# Patient Record
Sex: Female | Born: 1985 | Race: White | Hispanic: No | Marital: Married | Smoking: Never smoker
Health system: Southern US, Community
[De-identification: ages and names within clinical notes are randomized; demographics above are authoritative.]

## PROBLEM LIST (undated history)

## (undated) ENCOUNTER — Inpatient Hospital Stay: Payer: Self-pay

## (undated) DIAGNOSIS — O1493 Unspecified pre-eclampsia, third trimester: Secondary | ICD-10-CM

## (undated) DIAGNOSIS — S42009A Fracture of unspecified part of unspecified clavicle, initial encounter for closed fracture: Secondary | ICD-10-CM

## (undated) DIAGNOSIS — S92909A Unspecified fracture of unspecified foot, initial encounter for closed fracture: Secondary | ICD-10-CM

## (undated) DIAGNOSIS — I341 Nonrheumatic mitral (valve) prolapse: Secondary | ICD-10-CM

## (undated) DIAGNOSIS — O1490 Unspecified pre-eclampsia, unspecified trimester: Secondary | ICD-10-CM

## (undated) HISTORY — PX: NO PAST SURGERIES: SHX2092

## (undated) HISTORY — PX: WISDOM TOOTH EXTRACTION: SHX21

---
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
## 2007-12-22 ENCOUNTER — Emergency Department: Payer: Self-pay | Admitting: Emergency Medicine

## 2007-12-27 ENCOUNTER — Emergency Department: Payer: Self-pay | Admitting: Emergency Medicine

## 2009-05-05 ENCOUNTER — Emergency Department: Payer: Self-pay | Admitting: Emergency Medicine

## 2011-11-20 ENCOUNTER — Emergency Department: Payer: Self-pay | Admitting: Emergency Medicine

## 2015-08-09 NOTE — L&D Delivery Note (Signed)
Delivery Note At 5:07 PM a vigorous female infant was delivered via Vaginal, Spontaneous Delivery (Presentation: Left Occiput Anterior).  APGAR: 8, 9; weight 6 lb 2 oz (2778 g).   Placenta status: Intact, Spontaneous.  Cord: 3 vessels with the following complications: None.    Anesthesia: Epidural  Episiotomy: None Lacerations: 2nd degree Suture Repair: 2-0 Vicryl, 3-0 Chromic Est. Blood Loss (mL): 400  Mom to postpartum.  Baby to Couplet care / Skin to Skin.  Rachael Morris, Rachael Morris 12/02/2015, 6:28 PM

## 2015-08-11 ENCOUNTER — Observation Stay
Admission: AD | Admit: 2015-08-11 | Discharge: 2015-08-11 | Disposition: A | Discharge status: Home / Self Care | Payer: Managed Care, Other (non HMO) | Source: Ambulatory Visit | Attending: Obstetrics and Gynecology | Admitting: Obstetrics and Gynecology

## 2015-08-11 ENCOUNTER — Encounter: Payer: Self-pay | Admitting: Emergency Medicine

## 2015-08-11 ENCOUNTER — Emergency Department
Admission: EM | Admit: 2015-08-11 | Discharge: 2015-08-11 | Disposition: A | Discharge status: Home / Self Care | Payer: Managed Care, Other (non HMO) | Attending: Emergency Medicine | Admitting: Emergency Medicine

## 2015-08-11 DIAGNOSIS — M25512 Pain in left shoulder: Secondary | ICD-10-CM

## 2015-08-11 DIAGNOSIS — S4992XA Unspecified injury of left shoulder and upper arm, initial encounter: Secondary | ICD-10-CM | POA: Diagnosis not present

## 2015-08-11 DIAGNOSIS — Y9389 Activity, other specified: Secondary | ICD-10-CM | POA: Diagnosis not present

## 2015-08-11 DIAGNOSIS — Y998 Other external cause status: Secondary | ICD-10-CM | POA: Diagnosis not present

## 2015-08-11 DIAGNOSIS — Z3A22 22 weeks gestation of pregnancy: Secondary | ICD-10-CM | POA: Insufficient documentation

## 2015-08-11 DIAGNOSIS — Y92410 Unspecified street and highway as the place of occurrence of the external cause: Secondary | ICD-10-CM | POA: Diagnosis not present

## 2015-08-11 DIAGNOSIS — O9A212 Injury, poisoning and certain other consequences of external causes complicating pregnancy, second trimester: Secondary | ICD-10-CM | POA: Diagnosis not present

## 2015-08-11 HISTORY — DX: Nonrheumatic mitral (valve) prolapse: I34.1

## 2015-08-11 NOTE — Discharge Instructions (Signed)
You can use ice 20 minutes every hour or to the shoulder or heat whichever makes it feel better. Do not fall asleep with ice or heating pad on the area because she can get burns or frostbite. You can use Tylenol for pain. [facility] when we let you out of here. They will make sure the baby is doing okay. Please follow-up with your doctor or Dr. Hyacinth MeekerMiller the orthopedic doctor to check on your shoulder is worse or not any better in 2-3 days.

## 2015-08-11 NOTE — Discharge Instructions (Signed)
Call your doctor or return to the Emergency Department if you have any questions or concerns.Rest and hydrate.

## 2015-08-11 NOTE — ED Notes (Signed)
Pt will taken upstairs to labor and delivery for fetal monitoring.
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
## 2015-08-26 ENCOUNTER — Encounter: Payer: Self-pay | Admitting: Physician Assistant

## 2015-08-26 ENCOUNTER — Ambulatory Visit: Payer: Self-pay | Admitting: Physician Assistant

## 2015-08-26 VITALS — BP 100/65 | HR 97 | Temp 98.4°F

## 2015-08-26 DIAGNOSIS — J00 Acute nasopharyngitis [common cold]: Secondary | ICD-10-CM

## 2015-08-26 NOTE — Progress Notes (Signed)
S: C/o runny nose and congestion with scratchy throat for 2 days, no fever, chills, cp/sob, v/d;  cough is sporadic, is 24 weeks preg, has not tried any otc meds  O: PE: vitals wnl, nad, perrl eomi, normocephalic, tms dull, nasal mucosa red and swollen, throat injected, neck supple no lymph, lungs c t a, cv rrr, neuro intact  A:  Acute viral uri   P: drink fluids, continue regular meds , use otc meds of choice like tylenol cold which is approved by ob/gyn, return if not improving in 5 days, return earlier if worsening

## 2015-09-17 ENCOUNTER — Encounter: Payer: Self-pay | Admitting: Obstetrics and Gynecology

## 2015-12-01 ENCOUNTER — Inpatient Hospital Stay
Admission: EM | Admit: 2015-12-01 | Discharge: 2015-12-04 | DRG: 775 | Disposition: A | Discharge status: Home / Self Care | Payer: Managed Care, Other (non HMO) | Attending: Certified Nurse Midwife | Admitting: Certified Nurse Midwife

## 2015-12-01 ENCOUNTER — Encounter: Payer: Self-pay | Admitting: *Deleted

## 2015-12-01 DIAGNOSIS — Z3A37 37 weeks gestation of pregnancy: Secondary | ICD-10-CM | POA: Diagnosis not present

## 2015-12-01 DIAGNOSIS — O1493 Unspecified pre-eclampsia, third trimester: Secondary | ICD-10-CM | POA: Diagnosis present

## 2015-12-01 DIAGNOSIS — Z23 Encounter for immunization: Secondary | ICD-10-CM

## 2015-12-01 DIAGNOSIS — O1494 Unspecified pre-eclampsia, complicating childbirth: Secondary | ICD-10-CM | POA: Diagnosis present

## 2015-12-01 DIAGNOSIS — O163 Unspecified maternal hypertension, third trimester: Secondary | ICD-10-CM | POA: Diagnosis present

## 2015-12-01 HISTORY — DX: Unspecified pre-eclampsia, third trimester: O14.93

## 2015-12-01 HISTORY — DX: Unspecified pre-eclampsia, unspecified trimester: O14.90

## 2015-12-01 LAB — URINE DRUG SCREEN, QUALITATIVE (ARMC ONLY)
AMPHETAMINES, UR SCREEN: NOT DETECTED
BENZODIAZEPINE, UR SCRN: NOT DETECTED
Barbiturates, Ur Screen: NOT DETECTED
COCAINE METABOLITE, UR ~~LOC~~: NOT DETECTED
Cannabinoid 50 Ng, Ur ~~LOC~~: NOT DETECTED
MDMA (Ecstasy)Ur Screen: NOT DETECTED
METHADONE SCREEN, URINE: NOT DETECTED
OPIATE, UR SCREEN: NOT DETECTED
PHENCYCLIDINE (PCP) UR S: NOT DETECTED
Tricyclic, Ur Screen: NOT DETECTED

## 2015-12-01 LAB — COMPREHENSIVE METABOLIC PANEL
ALBUMIN: 2.8 g/dL — AB (ref 3.5–5.0)
ALK PHOS: 142 U/L — AB (ref 38–126)
ALT: 15 U/L (ref 14–54)
AST: 21 U/L (ref 15–41)
Anion gap: 7 (ref 5–15)
BILIRUBIN TOTAL: 0.5 mg/dL (ref 0.3–1.2)
BUN: 11 mg/dL (ref 6–20)
CALCIUM: 9 mg/dL (ref 8.9–10.3)
CO2: 23 mmol/L (ref 22–32)
Chloride: 106 mmol/L (ref 101–111)
Creatinine, Ser: 0.66 mg/dL (ref 0.44–1.00)
GFR calc Af Amer: >60 mL/min (ref >60)
GFR calc non Af Amer: >60 mL/min (ref >60)
GLUCOSE: 104 mg/dL — AB (ref 65–99)
POTASSIUM: 4 mmol/L (ref 3.5–5.1)
SODIUM: 136 mmol/L (ref 135–145)
TOTAL PROTEIN: 6.1 g/dL — AB (ref 6.5–8.1)

## 2015-12-01 LAB — TYPE AND SCREEN
ABO/RH(D): B POS
Antibody Screen: NEGATIVE

## 2015-12-01 LAB — PROTEIN / CREATININE RATIO, URINE
Creatinine, Urine: 183 mg/dL
Protein Creatinine Ratio: 0.34 mg/mg{Cre} — ABNORMAL HIGH (ref 0.00–0.15)
TOTAL PROTEIN, URINE: 62 mg/dL

## 2015-12-01 LAB — CBC
HEMATOCRIT: 30.6 % — AB (ref 35.0–47.0)
Hemoglobin: 10.7 g/dL — ABNORMAL LOW (ref 12.0–16.0)
MCH: 29.7 pg (ref 26.0–34.0)
MCHC: 35 g/dL (ref 32.0–36.0)
MCV: 84.9 fL (ref 80.0–100.0)
Platelets: 247 10*3/uL (ref 150–440)
RBC: 3.61 MIL/uL — ABNORMAL LOW (ref 3.80–5.20)
RDW: 14.3 % (ref 11.5–14.5)
WBC: 9 10*3/uL (ref 3.6–11.0)

## 2015-12-01 LAB — ABO/RH: ABO/RH(D): B POS

## 2015-12-01 MED ORDER — LACTATED RINGERS IV SOLN
INTRAVENOUS | Status: DC
  Administered 2015-12-01 – 2015-12-02 (×3): via INTRAVENOUS

## 2015-12-01 MED ORDER — OXYTOCIN 10 UNIT/ML IJ SOLN
INTRAMUSCULAR | Status: AC
  Filled 2015-12-01: qty 2

## 2015-12-01 MED ORDER — DINOPROSTONE 10 MG VA INST
10.0000 mg | VAGINAL_INSERT | Freq: Once | VAGINAL | Status: AC
  Administered 2015-12-01: 10 mg via VAGINAL
  Filled 2015-12-01 (×2): qty 1

## 2015-12-01 MED ORDER — AMMONIA AROMATIC IN INHA
RESPIRATORY_TRACT | Status: AC
  Filled 2015-12-01: qty 10

## 2015-12-01 MED ORDER — OXYTOCIN 40 UNITS IN LACTATED RINGERS INFUSION - SIMPLE MED
2.5000 [IU]/h | INTRAVENOUS | Status: DC
  Filled 2015-12-01: qty 1000

## 2015-12-01 MED ORDER — ONDANSETRON HCL 4 MG/2ML IJ SOLN
4.0000 mg | Freq: Four times a day (QID) | INTRAMUSCULAR | Status: DC | PRN
  Administered 2015-12-02 (×2): 4 mg via INTRAVENOUS
  Filled 2015-12-01 (×2): qty 2

## 2015-12-01 MED ORDER — OXYTOCIN BOLUS FROM INFUSION: 500.0000 mL | INTRAVENOUS | Status: DC

## 2015-12-01 MED ORDER — LIDOCAINE HCL (PF) 1 % IJ SOLN: 30.0000 mL | INTRAMUSCULAR | Status: DC | PRN

## 2015-12-01 MED ORDER — ZOLPIDEM TARTRATE 5 MG PO TABS
5.0000 mg | ORAL_TABLET | Freq: Every evening | ORAL | Status: DC | PRN
  Administered 2015-12-01: 5 mg via ORAL
  Filled 2015-12-01: qty 1

## 2015-12-01 MED ORDER — ACETAMINOPHEN 325 MG PO TABS: 650.0000 mg | ORAL_TABLET | ORAL | Status: DC | PRN

## 2015-12-01 MED ORDER — SODIUM CHLORIDE FLUSH 0.9 % IV SOLN
INTRAVENOUS | Status: AC
  Administered 2015-12-01: 21:00:00
  Filled 2015-12-01: qty 10

## 2015-12-01 MED ORDER — LIDOCAINE HCL (PF) 1 % IJ SOLN
INTRAMUSCULAR | Status: AC
  Filled 2015-12-01: qty 30

## 2015-12-01 MED ORDER — MISOPROSTOL 200 MCG PO TABS
ORAL_TABLET | ORAL | Status: AC
  Filled 2015-12-01: qty 4

## 2015-12-01 MED ORDER — CITRIC ACID-SODIUM CITRATE 334-500 MG/5ML PO SOLN: 30.0000 mL | ORAL | Status: DC | PRN

## 2015-12-01 MED ORDER — LACTATED RINGERS IV SOLN: 500.0000 mL | INTRAVENOUS | Status: DC | PRN

## 2015-12-01 MED ORDER — TERBUTALINE SULFATE 1 MG/ML IJ SOLN: 0.2500 mg | Freq: Once | INTRAMUSCULAR | Status: DC | PRN

## 2015-12-01 NOTE — Plan of Care (Signed)
Problem: Education: Goal: Knowledge of disease or condition will improve Outcome: Progressing MD discussed elevated bp, lab results, and pre-e disease process with pt and family, questions addressed, pt agrees with plan for IOL with Cervidil.    Problem: Coping: Goal: Coping behaviors will improve Outcome: Progressing Pt a bit anxious about labor induction but handling process well at this time, allowed to eat dinner prior to med being place for IOL. PIV in place, Cervidil placed at 2154. Says she will need something to help her rest. Pt tolerating induction process at this time.

## 2015-12-01 NOTE — OB Triage Note (Signed)
Sent from office for elevated BP. Pt denies headache, abdominal pain, spots in eyes. Reflexes 3+, no clonus. 1+ edema noted in ankles and feet.  Pt. States some discomfort with urination. Denies burning.

## 2015-12-01 NOTE — H&P (Signed)
Obstetric H&P   Chief Complaint: Elevated BP in clinic  Prenatal Care Provider: [facility]  History of Present Illness: 30 y.o. G1P0 at 673468w6d by 7 week ultrasound presenting to L&D for elevated BP noted at routine prenatal visit in clinic today (144/92), negative protein, 2lbs weight gain in last week.  The patient has no history of pre-existing hypertension, has been normotensive at her other prenatal care appointments.  No HA, vision changes, RUQ/epigastric pain, increased edema.  +FM,denies LOF, VB, or ctx  B pos / ABSC neg / RI / VZNI / HBsAg neg / RPR NR / HIV neg / 1-hr 98 / GBS negative   31lbs weight gain this pregnancy  Review of Systems: 10 point review of systems negative unless otherwise noted in HPI  Past Medical History: Past Medical History  Diagnosis Date  . Mitral valve prolapse     Past Surgical History: No past surgical history on file.  Family History: No family history on file.  Social History: Social History   Social History  . Marital Status: Married    Spouse Name: N/A  . Number of Children: N/A  . Years of Education: N/A   Occupational History  . Not on file.   Social History Main Topics  . Smoking status: Never Smoker   . Smokeless tobacco: Not on file  . Alcohol Use: No  . Drug Use: No  . Sexual Activity: Yes    Birth Control/ Protection: None   Other Topics Concern  . Not on file   Social History Narrative    Medications: Prior to Admission medications   Medication Sig Start Date End Date Taking? Authorizing Provider  Prenatal Vit-Fe Fumarate-FA (PRENATAL MULTIVITAMIN) TABS tablet Take 1 tablet by mouth daily at 12 noon.    Historical Provider, MD    Allergies: Allergies  Allergen Reactions  . Codeine Other (See Comments)    Mood swings?    Physical Exam: Filed Vitals:   12/01/15 1629 12/01/15 1631 12/01/15 1650  BP:  128/87 134/80  Pulse:  88 91  Temp: 98.6 F (37 C)    TempSrc: Oral    Resp: 18    Height: 5\' 2"  (1.575  m)    Weight: 68.04 kg (150 lb)      Urine Dip Protein: P/C ratio pending  FHT: 135, moderate, +accels, no decels Toco: irregular  General: NAD HEENT: normocephalic, anicteric Pulmonary: no increased work of breathing Abdomen: Gravid, non-tender Leopolds: vtx Genitourinary: 1/50/-3 in clinic Extremities: no edema  Labs: No results found for this or any previous visit (from the past 24 hour(s)).  Assessment: 30 y.o. G1P0 at 5868w6d preeclampsia without severe features  Plan: 1) Preeclampsia without severe features -  Diastolic BP consistently elevated.    - proceed with cervidil IOL per ACOG "Hypertension in Pregnancy" Task Force Report November 14th 2013   2) Fetus - cat I tracing  3) PNL - B pos / ABSC neg / RI / VZNI / HBsAg neg / RPR NR / HIV neg / 1-hr 98 / GBS negative   4) TDAP - needs, influenza up to date  5) Disposition - pending delivery

## 2015-12-01 NOTE — Progress Notes (Signed)
Dr Bonney AidStaebler in room speaking with pt about lab, bp reading elevated, protein in urine and discussed IOL for pree without severe features. Spoke with pt, s/o, and family at length about induction and labor process.

## 2015-12-02 ENCOUNTER — Inpatient Hospital Stay: Payer: Managed Care, Other (non HMO) | Admitting: Anesthesiology

## 2015-12-02 LAB — PLATELET COUNT: PLATELETS: 233 10*3/uL (ref 150–440)

## 2015-12-02 MED ORDER — SODIUM CHLORIDE 0.9% FLUSH: 3.0000 mL | INTRAVENOUS | Status: DC | PRN

## 2015-12-02 MED ORDER — ONDANSETRON HCL 4 MG/2ML IJ SOLN: 4.0000 mg | INTRAMUSCULAR | Status: DC | PRN

## 2015-12-02 MED ORDER — BUPIVACAINE HCL (PF) 0.25 % IJ SOLN
INTRAMUSCULAR | Status: DC | PRN
  Administered 2015-12-02 (×2): 4 mL via EPIDURAL

## 2015-12-02 MED ORDER — FENTANYL 2.5 MCG/ML W/ROPIVACAINE 0.2% IN NS 100 ML EPIDURAL INFUSION (ARMC-ANES): 10.0000 mL/h | EPIDURAL | Status: DC

## 2015-12-02 MED ORDER — FENTANYL 2.5 MCG/ML W/ROPIVACAINE 0.2% IN NS 100 ML EPIDURAL INFUSION (ARMC-ANES)
EPIDURAL | Status: DC | PRN
  Administered 2015-12-02: 9 mL/h via EPIDURAL

## 2015-12-02 MED ORDER — ONDANSETRON HCL 4 MG/2ML IJ SOLN: 4.0000 mg | Freq: Three times a day (TID) | INTRAMUSCULAR | Status: DC | PRN

## 2015-12-02 MED ORDER — COCONUT OIL OIL
1.0000 "application " | TOPICAL_OIL | Status: DC | PRN
  Filled 2015-12-02: qty 120

## 2015-12-02 MED ORDER — DOCUSATE SODIUM 100 MG PO CAPS
100.0000 mg | ORAL_CAPSULE | Freq: Every day | ORAL | Status: DC
  Administered 2015-12-03 – 2015-12-04 (×2): 100 mg via ORAL
  Filled 2015-12-02 (×2): qty 1

## 2015-12-02 MED ORDER — NALOXONE HCL 2 MG/2ML IJ SOSY
1.0000 ug/kg/h | PREFILLED_SYRINGE | INTRAVENOUS | Status: DC | PRN
  Filled 2015-12-02: qty 2

## 2015-12-02 MED ORDER — NALBUPHINE HCL 10 MG/ML IJ SOLN: 5.0000 mg | Freq: Once | INTRAMUSCULAR | Status: DC | PRN

## 2015-12-02 MED ORDER — LIDOCAINE HCL (PF) 1 % IJ SOLN
INTRAMUSCULAR | Status: DC | PRN
  Administered 2015-12-02: 3 mL

## 2015-12-02 MED ORDER — FENTANYL 2.5 MCG/ML W/ROPIVACAINE 0.2% IN NS 100 ML EPIDURAL INFUSION (ARMC-ANES)
9.0000 mL/h | EPIDURAL | Status: DC
  Filled 2015-12-02: qty 100

## 2015-12-02 MED ORDER — KETOROLAC TROMETHAMINE 30 MG/ML IJ SOLN: 30.0000 mg | Freq: Four times a day (QID) | INTRAMUSCULAR | Status: DC | PRN

## 2015-12-02 MED ORDER — ONDANSETRON HCL 4 MG PO TABS: 4.0000 mg | ORAL_TABLET | ORAL | Status: DC | PRN

## 2015-12-02 MED ORDER — TERBUTALINE SULFATE 1 MG/ML IJ SOLN
0.2500 mg | Freq: Once | INTRAMUSCULAR | Status: DC | PRN
Start: 2015-12-02 — End: 2015-12-02

## 2015-12-02 MED ORDER — SIMETHICONE 80 MG PO CHEW: 80.0000 mg | CHEWABLE_TABLET | ORAL | Status: DC | PRN

## 2015-12-02 MED ORDER — HYDROCODONE-ACETAMINOPHEN 5-325 MG PO TABS
1.0000 | ORAL_TABLET | Freq: Four times a day (QID) | ORAL | Status: DC | PRN
  Administered 2015-12-03 (×2): 2 via ORAL
  Administered 2015-12-03 – 2015-12-04 (×2): 1 via ORAL
  Filled 2015-12-02: qty 1
  Filled 2015-12-02 (×2): qty 2
  Filled 2015-12-02: qty 1

## 2015-12-02 MED ORDER — MEPERIDINE HCL 25 MG/ML IJ SOLN
6.2500 mg | INTRAMUSCULAR | Status: DC | PRN
Start: 2015-12-02 — End: 2015-12-02

## 2015-12-02 MED ORDER — DIBUCAINE 1 % RE OINT: 1.0000 "application " | TOPICAL_OINTMENT | RECTAL | Status: DC | PRN

## 2015-12-02 MED ORDER — DIPHENHYDRAMINE HCL 25 MG PO CAPS: 25.0000 mg | ORAL_CAPSULE | ORAL | Status: DC | PRN

## 2015-12-02 MED ORDER — WITCH HAZEL-GLYCERIN EX PADS: 1.0000 "application " | MEDICATED_PAD | CUTANEOUS | Status: DC | PRN

## 2015-12-02 MED ORDER — NALBUPHINE HCL 10 MG/ML IJ SOLN: 5.0000 mg | INTRAMUSCULAR | Status: DC | PRN

## 2015-12-02 MED ORDER — FERROUS SULFATE 325 (65 FE) MG PO TABS
325.0000 mg | ORAL_TABLET | Freq: Every day | ORAL | Status: DC
  Administered 2015-12-03 – 2015-12-04 (×2): 325 mg via ORAL
  Filled 2015-12-02 (×2): qty 1

## 2015-12-02 MED ORDER — BUTORPHANOL TARTRATE 1 MG/ML IJ SOLN
1.0000 mg | INTRAMUSCULAR | Status: DC | PRN
  Administered 2015-12-02 (×4): 1 mg via INTRAVENOUS
  Filled 2015-12-02: qty 2
  Filled 2015-12-02 (×3): qty 1

## 2015-12-02 MED ORDER — OXYTOCIN 40 UNITS IN LACTATED RINGERS INFUSION - SIMPLE MED
1.0000 m[IU]/min | INTRAVENOUS | Status: DC
  Administered 2015-12-02: 1 m[IU]/min via INTRAVENOUS

## 2015-12-02 MED ORDER — DIPHENHYDRAMINE HCL 50 MG/ML IJ SOLN: 12.5000 mg | INTRAMUSCULAR | Status: DC | PRN

## 2015-12-02 MED ORDER — PRENATAL MULTIVITAMIN CH
1.0000 | ORAL_TABLET | Freq: Every day | ORAL | Status: DC
  Administered 2015-12-04: 1 via ORAL
  Filled 2015-12-02: qty 1

## 2015-12-02 MED ORDER — BENZOCAINE-MENTHOL 20-0.5 % EX AERO
1.0000 "application " | INHALATION_SPRAY | CUTANEOUS | Status: DC | PRN
  Filled 2015-12-02 (×2): qty 56

## 2015-12-02 MED ORDER — IBUPROFEN 600 MG PO TABS
600.0000 mg | ORAL_TABLET | Freq: Four times a day (QID) | ORAL | Status: DC
  Administered 2015-12-02 – 2015-12-04 (×7): 600 mg via ORAL
  Filled 2015-12-02 (×7): qty 1

## 2015-12-02 MED ORDER — LIDOCAINE-EPINEPHRINE (PF) 1.5 %-1:200000 IJ SOLN
INTRAMUSCULAR | Status: DC | PRN
  Administered 2015-12-02: 3 mL via EPIDURAL

## 2015-12-02 MED ORDER — NALOXONE HCL 0.4 MG/ML IJ SOLN: 0.4000 mg | INTRAMUSCULAR | Status: DC | PRN

## 2015-12-02 NOTE — Progress Notes (Signed)
L&D Progress Note  [age] year old G1 P0 with EDC=12/16/2015 was admitted yesterday for IOL for preeclampsia without severe features. She had Cervidil inserted last night and it was removed this Am at 0740 due to hyperstimulation. Cervix last checked at 0330 and was 1.5/60%. She has received multiple doses of Stadol and is not very tolerant of cervical exams.  S: Nauseous intermittently, tired, has not slept, complains of painful contractions O: BP 133/66 mmHg  Pulse 71  Temp(Src) 97.7 F (36.5 C) (Axillary)  Resp 16  Ht 5\' 2"  (1.575 m)  Wt 68.04 kg (150 lb)  BMI 27.43 kg/m2  SpO2 99% Most blood pressures overnight in the mild range: 130s to 140s/80s-90s  General: trouble keeping eyes open, wakes with contractions [id number] with accelerations to 140, moderate variability Toco: q1-3 min apart, some palpating moderate Cervix: 1.5/75%/-1  A: IUP at 38 weeks with preeclampsia without severe features  BPs in the normal to mild range Little progress with the contractions she is currently having Reassuring FHR tracing  P: Discussed Pitocin augmentation after instituting better pain relief. Will try nitrous oxide in early labor and move to an epidural once more active. Consider foley catheter if contracting too frequently or no cx change  Derrin Currey, CNM  '

## 2015-12-02 NOTE — Plan of Care (Signed)
Problem: Education: Goal: Knowledge of disease or condition will improve Outcome: Progressing BP remains stable and wnl this shift, no BP treated. Pt denies any ha pain, vision changes, dizziness, chest pain, sob or epigastric pain. Occasionally feeling nauseated after Stadol, resolved with Zofran IV. Pt tolerating labor well, however, she does have difficulty tolerating cervical exam. Coaching and family support encouraged and promoted. Cervidil remains in place.

## 2015-12-02 NOTE — Progress Notes (Signed)
L&D Progress Note   S: Contractions have gotten stronger since SROM about 45 minutes ago. Nitrous oxide working well.  O: BP 116/71 mmHg  Pulse 79  Temp(Src) 97.7 F (36.5 C) (Axillary)  Resp 16  Ht 5\' 2"  (1.575 m)  Wt 68.04 kg (150 lb)  BMI 27.43 kg/m2  SpO2 100%  Blood pressure great with use of nitrous-better pain relief. SROM for clear fluid around 1030 FHR: 130 with early decelerations Toco: Contractions q1-2 minutes apart. Pitocin only on 1 miu/min Cervix: 3/80-90%/-1  A: Progressing  P: Will discontinue nitrous, in preparation for epidural (anesthesia wants 1 hour wait) Platelet count repeated DC Pitocin  Donabelle Molden, CNM

## 2015-12-02 NOTE — Discharge Summary (Signed)
Physician Obstetric Discharge Summary  Patient ID: Rachael Morris Adelsberger MRN: [id number] DOB/AGE: [birth year] 30 y.o.   Date of Admission: 12/01/2015  Date of Discharge: 12/04/2015  Admitting Diagnosis: Preeclampsia without severe features, IUP at [redacted] weeks gestation at 5199w0d  Secondary Diagnosis: Induction of labor  Mode of Delivery: normal spontaneous vaginal delivery 12/02/2015      Discharge Diagnosis: Term pregnancy delivered   Intrapartum Procedures: epidural, pitocin augmentation and repair of lacerations   Post partum procedures: none  Complications: none   Brief Hospital Course  Rachael Morris [name] is a G1P1001 who had a SVD on 12/02/2015 after an induction of labor for preeclampsia without severe features;  for further details of this delivery, please refer to the delivery note.  Patient had an uncomplicated postpartum course.  By time of discharge on PPD#2, her pain was controlled on oral pain medications; she had appropriate lochia and was ambulating, voiding without difficulty and tolerating regular diet.  She was deemed stable for discharge to home.    Labs: CBC Latest Ref Rng 12/03/2015 12/02/2015 12/01/2015  WBC 3.6 - 11.0 K/uL 13.3(H) - 9.0  Hemoglobin 12.0 - 16.0 g/dL 0.8(M9.5(Morris) - 10.7(Morris)  Hematocrit 35.0 - 47.0 % 27.6(Morris) - 30.6(Morris)  Platelets 150 - 440 K/uL 235 233 247   B POS  Physical exam:  Blood pressure 129/87, pulse 84, temperature 97.9 F (36.6 C), temperature source Oral, resp. rate 18, height 5\' 2"  (1.575 m), weight 68.04 kg (150 lb), SpO2 99 %, breastfeeding General: alert and no distress Lochia: appropriate Abdomen: soft, NT Uterine Fundus: firm Incision: NA Extremities: No evidence of DVT seen on physical exam. No lower extremity edema.  Discharge Instructions: Per After Visit Summary. Activity: Advance as tolerated. Pelvic rest for 6 weeks.  Also refer to Discharge Instructions Diet: Regular Medications:   Medication List    TAKE these medications        norethindrone 5 MG tablet  Commonly known as:  AYGESTIN  Take 1 tablet (5 mg total) by mouth daily.  Start taking on:  12/16/2015     prenatal multivitamin Tabs tablet  Take 1 tablet by mouth daily at 12 noon.       Outpatient follow up:  Follow-up Information    Follow up with GUTIERREZ, COLLEEN, CNM. Schedule an appointment as soon as possible for a visit in 6 weeks.   Specialty:  Certified Nurse Midwife   Contact information:   [address] [place] [address] [phone]      Postpartum contraception: oral progesterone-only contraceptive  Discharged Condition: good  Discharged to: home   Newborn Data: Disposition:home with mother  Apgars: APGAR (1 MIN): 8   APGAR (5 MINS): 9   APGAR (10 MINS):    Baby Feeding: Breast/ Janifer AdieAdeline   Ioane Morris, CNM

## 2015-12-02 NOTE — Anesthesia Procedure Notes (Signed)
Epidural Patient location during procedure: OB Start time: 12/02/2015 12:25 PM End time: 12/02/2015 12:40 PM  Staffing Anesthesiologist: Lenard SimmerKARENZ, ANDREW Resident/CRNA: Malva CoganBEANE, Lasean Rahming Performed by: resident/CRNA   Preanesthetic Checklist Completed: patient identified, site marked, surgical consent, pre-op evaluation, timeout performed, IV checked, risks and benefits discussed and monitors and equipment checked  Epidural Patient position: sitting Prep: Betadine Patient monitoring: heart rate, continuous pulse ox and blood pressure Approach: midline Location: L3-L4 Injection technique: LOR saline  Needle:  Needle type: Tuohy  Needle gauge: 18 G Needle length: 9 cm and 9 Needle insertion depth: 5 cm Catheter type: closed end flexible Catheter size: 20 Guage Catheter at skin depth: 10 cm Test dose: negative and 1.5% lidocaine with Epi 1:200 K  Assessment Events: blood not aspirated, injection not painful, no injection resistance, negative IV test and no paresthesia  Additional Notes   Patient tolerated the insertion well without complications.Reason for block:procedure for pain

## 2015-12-02 NOTE — Anesthesia Preprocedure Evaluation (Signed)
Anesthesia Evaluation  Patient identified by MRN, date of birth, ID band Patient awake    Reviewed: Allergy & Precautions, NPO status , Patient's Chart, lab work & pertinent test results  Airway Mallampati: II  TM Distance: >3 FB     Dental  (+) Dental Advisory Given   Pulmonary           Cardiovascular hypertension,      Neuro/Psych    GI/Hepatic GERD  ,  Endo/Other    Renal/GU      Musculoskeletal   Abdominal   Peds  Hematology   Anesthesia Other Findings   Reproductive/Obstetrics (+) Pregnancy                             Anesthesia Physical Anesthesia Plan  ASA: II  Anesthesia Plan: Epidural   Post-op Pain Management:    Induction:   Airway Management Planned:   Additional Equipment:   Intra-op Plan:   Post-operative Plan:   Informed Consent: I have reviewed the patients History and Physical, chart, labs and discussed the procedure including the risks, benefits and alternatives for the proposed anesthesia with the patient or authorized representative who has indicated his/her understanding and acceptance.   Dental advisory given  Plan Discussed with:   Anesthesia Plan Comments:         Anesthesia Quick Evaluation

## 2015-12-02 NOTE — Progress Notes (Signed)
Deceleration noted while pushing . Pt turned to left side. o2 placed via mask at 10 l. C. Gutierrez,cnm in room and aware

## 2015-12-03 LAB — CBC
HCT: 27.6 % — ABNORMAL LOW (ref 35.0–47.0)
Hemoglobin: 9.5 g/dL — ABNORMAL LOW (ref 12.0–16.0)
MCH: 29.8 pg (ref 26.0–34.0)
MCHC: 34.3 g/dL (ref 32.0–36.0)
MCV: 86.8 fL (ref 80.0–100.0)
PLATELETS: 235 10*3/uL (ref 150–440)
RBC: 3.18 MIL/uL — ABNORMAL LOW (ref 3.80–5.20)
RDW: 14.4 % (ref 11.5–14.5)
WBC: 13.3 10*3/uL — AB (ref 3.6–11.0)

## 2015-12-03 LAB — RPR: RPR Ser Ql: NONREACTIVE

## 2015-12-03 NOTE — Lactation Note (Signed)
This note was copied from a baby's chart. Lactation Consultation Note  Patient Name: Rachael Adolm [name] ZOXWR'UToday's Date: 12/03/2015 Reason for consult: Initial assessment      Feeding Feeding Type: Breast Fed Length of feed: 20 min  LATCH Score/Interventions Latch: Grasps breast easily, tongue down, lips flanged, rhythmical sucking.  Audible Swallowing: Spontaneous and intermittent  Type of Nipple: Everted at rest and after stimulation  Comfort (Breast/Nipple): Filling, red/small blisters or bruises, mild/mod discomfort (compression mark on right breast)  Problem noted: Mild/Moderate discomfort;Cracked, bleeding, blisters, bruises        Lactation Tools Discussed/Used WIC Program: No   Consult Status Consult Status: Follow-up    Rachael Morris 12/03/2015, 11:32 AM

## 2015-12-03 NOTE — Anesthesia Post-op Follow-up Note (Signed)
  Anesthesia Pain Follow-up Note  Patient: Rachael Morris  Day #: 1  Date of Follow-up: 12/03/2015 Time: 7:16 AM  Last Vitals:  Filed Vitals:   12/03/15 0101 12/03/15 0432  BP: 111/75 104/67  Pulse: 106 90  Temp: 37.7 C 37 C  Resp: 18 20    Level of Consciousness: alert  Pain: mild   Side Effects:None  Catheter Site Exam: site not evaluated  Plan: D/C from anesthesia care  Clydene PughBeane, Thailyn Khalid D

## 2015-12-03 NOTE — Anesthesia Postprocedure Evaluation (Signed)
Anesthesia Post Note  Patient: Rachael Morris  Procedure(s) Performed: * No procedures listed *  Patient location during evaluation: Mother Baby Anesthesia Type: Epidural Level of consciousness: oriented and awake and alert Pain management: satisfactory to patient Vital Signs Assessment: post-procedure vital signs reviewed and stable Respiratory status: respiratory function stable Cardiovascular status: stable Postop Assessment: no backache, no headache, epidural receding, patient able to bend at knees, adequate PO intake and no signs of nausea or vomiting Anesthetic complications: no    Last Vitals:  Filed Vitals:   12/03/15 0101 12/03/15 0432  BP: 111/75 104/67  Pulse: 106 90  Temp: 37.7 C 37 C  Resp: 18 20    Last Pain:  Filed Vitals:   12/03/15 0439  PainSc: 1                  Clydene PughBeane, Leylany Nored D

## 2015-12-03 NOTE — Progress Notes (Signed)
Post Partum Day 1 Subjective: Doing well, no complaints.  Tolerating regular diet, pain with PO meds, voiding and ambulating without difficulty.  No CP SOB F/C N/V or leg pain No HA, change of vision, RUQ/epigastric pain  Objective: BP 107/71 mmHg  Pulse 92  Temp(Src) 98.6 F (37 C) (Oral)  Resp 18  Ht 5\' 2"  (1.575 m)  Wt 68.04 kg (150 lb)  BMI 27.43 kg/m2  SpO2 98%  Breastfeeding  Physical Exam:  General: NAD CV: RRR Pulm: nl effort, CTABL Lochia: moderate Uterine Fundus: fundus firm and below umbilicus DVT Evaluation: no cords, ttp LEs    Recent Labs  12/01/15 1655 12/02/15 1049 12/03/15 0440  HGB 10.7*  --  9.5*  HCT 30.6*  --  27.6*  WBC 9.0  --  13.3*  PLT 247 233 235    Assessment/Plan: 30 y.o. G1P1001 postpartum day # 1  1. Continue routine postpartum care 2. Breastfeeding 3. TDAP- needs vaccination 4. Varicella Non Immune- OK with vaccination 5. Contraception: considering [place] 5. Disposition: home tomorrow    Tresea MallGLEDHILL,Teofila Bowery, CNM

## 2015-12-04 MED ORDER — VARICELLA VIRUS VACCINE LIVE 1350 PFU/0.5ML IJ SUSR
0.5000 mL | Freq: Once | INTRAMUSCULAR | Status: AC
  Administered 2015-12-04: 0.5 mL via SUBCUTANEOUS
  Filled 2015-12-04 (×2): qty 0.5

## 2015-12-04 MED ORDER — NORETHINDRONE ACETATE 5 MG PO TABS: 5.0000 mg | ORAL_TABLET | Freq: Every day | ORAL | Status: DC

## 2015-12-04 NOTE — Discharge Instructions (Signed)
Bleeding: Your bleeding could continue up to 6 weeks, the flow should gradually decrease and the color should become dark then lightened over the next couple of weeks. If you notice you are bleeding heavily or passing clots larger than the size of your fist, PLEASE call your physician. No TAMPONS, DOUCHING, ENEMAS OR SEXUAL INTERCOURSE for 6 weeks.  ° °Stitches: Shower daily with mild soap and water. Stitches will dissolve over the next couple of weeks, if you experience any discomfort in the vaginal area you may sit in warm water 15-20 minutes, 3-4 times per day. Just enough water to cover vaginal area.  ° °AfterPains: This is the uterus contracting back to its normal position and size. Use medications prescribed or recommended by your physician to help relieve this discomfort.  ° °Bowels/Hemorrhoids: Drink plenty of water and stay active. Increase fiber, fresh fruits and vegetables in your diet.  ° °Rest/Activity: Rest when the baby is resting ° °Bathing: Shower daily! ° °Diet: Continue to eat extra calories until your follow up visit to help replenish nutrients and vitamins. If breastfeeding eat an extra 500-1000 and increase your fluid intake to 12 glasses a day.  ° °Contraception: Consult with your physician on what method of birth control you would like to use.  ° °Postpartum "BLUES": It is common to emotional days after delivery, however if it persist for greater than 2 weeks or if you feel concerned please let your physician know immediately. This is hormone driven and nothing you can control so please let someone know how you feel. ° °Follow Up Visit: Please schedule a follow up visit with your physician.  °

## 2015-12-04 NOTE — Progress Notes (Signed)
   Follow Up Visit: Please schedule a follow up visit with your physician. Patient understands all discharge instructions and the need to make follow up appointments. Patient discharge via wheelchair with auxillary.

## 2016-01-25 ENCOUNTER — Encounter: Payer: Self-pay | Admitting: Physician Assistant

## 2016-01-25 ENCOUNTER — Ambulatory Visit: Payer: Self-pay | Admitting: Physician Assistant

## 2016-01-25 VITALS — BP 110/79 | HR 88 | Temp 98.0°F

## 2016-01-25 DIAGNOSIS — H6983 Other specified disorders of Eustachian tube, bilateral: Secondary | ICD-10-CM

## 2016-01-25 MED ORDER — PREDNISONE 10 MG PO TABS: 30.0000 mg | ORAL_TABLET | Freq: Every day | ORAL | Status: DC

## 2016-01-25 NOTE — Progress Notes (Signed)
S:  C/o ears popping and being stopped up, no drainage from ears, no fever/chills, no cough or congestion, some sinus pressure, remainder ros neg, is breast feeding Using otc meds without relief  O:  Vitals wnl, nad, tms dull b/l, nasal mucosa swollen and pale pink, throat wnl, neck supple no lymph, lungs c t a, cv rrr, neuro intact  A: acute eustachean tube dysfunction  P:  sudafed, prednisone 30mg  qd x 3d, claritin or allegra, return if not improving in 3 to 5 days, return earlier if worsening

## 2016-01-29 ENCOUNTER — Encounter: Payer: Self-pay | Admitting: Physician Assistant

## 2016-01-29 ENCOUNTER — Ambulatory Visit: Payer: Self-pay | Admitting: Physician Assistant

## 2016-01-29 VITALS — BP 100/60 | HR 99 | Temp 98.3°F

## 2016-01-29 DIAGNOSIS — H68003 Unspecified Eustachian salpingitis, bilateral: Secondary | ICD-10-CM

## 2016-01-29 NOTE — Progress Notes (Signed)
   Subjective:Bilateral ear pressure    Patient ID: Rachael Morris, female    DOB: Sep 01, 1985, 30 y.o.   MRN: [id number]  HPI Follow up for bilateral ear pressure from visit 4 days ago. Patient states compliant now for one week. No improvement with Prednisone. States pressure rotates from left to right. Denies vertigo. Patient is currently breast-feeding which limits her options for medication.    Review of Systems    Negative except for compliant. Objective:   Physical Exam VSS. HEENT for bulging non-erythematous TM. No adenopathy. Neck supple, Lungs CTA, and Heart RRR.       Assessment & Plan:Eustachian Tube dysfunction.  Continue supportive care. Consider ENT consult in no improvement over the next 3 days.

## 2016-08-02 ENCOUNTER — Ambulatory Visit: Payer: Self-pay | Admitting: Physician Assistant

## 2016-08-02 ENCOUNTER — Encounter: Payer: Self-pay | Admitting: Physician Assistant

## 2016-08-02 VITALS — BP 126/89 | HR 109 | Temp 98.5°F

## 2016-08-02 DIAGNOSIS — J020 Streptococcal pharyngitis: Secondary | ICD-10-CM

## 2016-08-02 LAB — POCT RAPID STREP A (OFFICE): Rapid Strep A Screen: POSITIVE — AB

## 2016-08-02 MED ORDER — METHYLPREDNISOLONE 4 MG PO TBPK: ORAL_TABLET | ORAL | 0 refills | Status: DC

## 2016-08-02 MED ORDER — AMOXICILLIN 875 MG PO TABS: 875.0000 mg | ORAL_TABLET | Freq: Two times a day (BID) | ORAL | 0 refills | Status: DC

## 2016-08-02 NOTE — Progress Notes (Signed)
S: c/o sore throat and fever for 2 days, no cough or congestion, some body aches, no v/d, no uti sx  O: vitals wnl, nad, throat red swollen with exudate, neck supple no lymph, lungs c t a, cv rrr, q strep +  A: acute strep throat  P: amoxil, medrol dose pack

## 2016-12-30 ENCOUNTER — Ambulatory Visit: Payer: Self-pay | Admitting: Physician Assistant

## 2017-01-31 ENCOUNTER — Ambulatory Visit: Payer: Self-pay | Admitting: Physician Assistant

## 2017-01-31 ENCOUNTER — Encounter: Payer: Self-pay | Admitting: Physician Assistant

## 2017-01-31 VITALS — BP 100/65 | HR 102 | Temp 98.5°F | Resp 16 | Ht 62.0 in | Wt 136.0 lb

## 2017-01-31 DIAGNOSIS — Z0000 Encounter for general adult medical examination without abnormal findings: Secondary | ICD-10-CM

## 2017-01-31 NOTE — Progress Notes (Signed)
S: pt here for wellness physical had biometrics for insurance purposes done at work, no complaints ros neg. PMH:  neg  Social: nonsmoker, no etoh, no drugs Fam: htn, cirrhosis from etoh abuse, mother's side of the family has breast CA and lung CA  O: vitals wnl, nad, ENT wnl, neck supple no lymph, lungs c t a, cv rrr, abd soft nontender bs normal all 4 quads  A: wellness physical  P: f/u with reg md as needed

## 2017-04-17 ENCOUNTER — Ambulatory Visit: Payer: Self-pay | Admitting: Physician Assistant

## 2017-04-19 ENCOUNTER — Encounter: Payer: Self-pay | Admitting: Certified Nurse Midwife

## 2017-04-19 ENCOUNTER — Ambulatory Visit (INDEPENDENT_AMBULATORY_CARE_PROVIDER_SITE_OTHER): Payer: Managed Care, Other (non HMO) | Admitting: Certified Nurse Midwife

## 2017-04-19 VITALS — BP 112/72 | HR 87 | Ht 62.0 in | Wt 136.0 lb

## 2017-04-19 DIAGNOSIS — N939 Abnormal uterine and vaginal bleeding, unspecified: Secondary | ICD-10-CM | POA: Diagnosis not present

## 2017-04-19 LAB — POCT URINE PREGNANCY: Preg Test, Ur: NEGATIVE

## 2017-04-19 LAB — POCT HEMOGLOBIN: Hemoglobin: 12.3 g/dL (ref 12.2–16.2)

## 2017-04-19 MED ORDER — LEVONORGEST-ETH ESTRADIOL-IRON 0.1-20 MG-MCG(21) PO TABS: 1.0000 | ORAL_TABLET | Freq: Every day | ORAL | 0 refills | Status: DC

## 2017-04-19 NOTE — Progress Notes (Signed)
Obstetrics & Gynecology Office Visit   Chief Complaint:  Chief Complaint  Patient presents with  . Menorrhagia    History of Present Illness: Rachael Morris is a 31 year old G1 P1001 married WF who presents with complaints of a extremely heavy menses that began 10 Sept 2018. She had a baby June 2017 and was amenorrheic while breast feeding. She stopped breastfeeding in December 2017 and her menses resumed 1-2 months later. Her menses were monthly, lasting 1 day with a lite flow. This current menses began a little early, and became heavier last night and this Am requiring a tampon change every 30-45 minutes this AM. Her bleeding is accompanied by cramping. She is using withdrawal for contraception. Has a history of heavier menses prior to her pregnancy. Reports feeling shaky and lightheaded this AM from the blood loss. Patient is in a M/M relationship with her husband of 5 years.   Review of Systems:  ROS -see HPI  Past Medical History:  Past Medical History:  Diagnosis Date  . Mitral valve prolapse   . Preeclampsia    w/o severe features    Past Surgical History:  Past Surgical History:  Procedure Laterality Date  . NO PAST SURGERIES      Gynecologic History: Patient's last menstrual period was 04/17/2017 (exact date).  Obstetric History:  OB History  Gravida Para Term Preterm AB Living  1 1 1     1   SAB TAB Ectopic Multiple Live Births        0 1    # Outcome Date GA Lbr Len/2nd Weight Sex Delivery Anes PTL Lv  1 Term 12/02/15 [id number] 02:49 / 03:10 6 lb 2 oz (2.778 kg) F Vag-Spont EPI  LIV      Family History:  Family History  Problem Relation Age of Onset  . Alcohol abuse Mother   . Cirrhosis Mother   . Hypertension Mother   . Heart disease Maternal Grandmother   . Lung cancer Maternal Grandfather        mets to bone    Social History:  Social History   Social History  . Marital status: Married    Spouse name: N/A  . Number of children: 1  . Years of  education: N/A   Occupational History  . Not on file.   Social History Main Topics  . Smoking status: Never Smoker  . Smokeless tobacco: Never Used  . Alcohol use No  . Drug use: No  . Sexual activity: Yes    Partners: Male    Birth control/ protection: None   Other Topics Concern  . Not on file   Social History Narrative  . No narrative on file    Allergies:  Allergies  Allergen Reactions  . Codeine Other (See Comments)    Mood swings?  nausea    Medications: Prior to Admission medications   Not on File    Physical Exam Vitals: BP 112/72   Pulse 87   Ht 5\' 2"  (1.575 m)   Wt 136 lb (61.7 kg)   LMP 04/17/2017 (Exact Date)   Breastfeeding? No   BMI 24.87 kg/m  Physical Exam  Constitutional: She is oriented to person, place, and time. She appears well-developed and well-nourished. No distress.  Eyes:  Conjunctiva pink  Neck: Normal range of motion. Neck supple. No thyromegaly present.  Cardiovascular: Normal rate and regular rhythm.   No murmur heard. Respiratory: Effort normal.  GI: Soft. She exhibits no distension and no mass.  There is no tenderness.  Genitourinary:  Genitourinary Comments: Vulva: small amt blood on vulva, no lesions or inflammation Vagina: small amt blood in vault Cervix: closed, mobile, NT, small amt blood at os Uterus: AV, , NSSC. Mobile, NT Adnexa: NO masses, NT  Neurological: She is alert and oriented to person, place, and time.  Skin: Skin is warm and dry. She is not diaphoretic.  Psychiatric: She has a normal mood and affect. Her behavior is normal. Judgment and thought content normal.   Results for orders placed or performed in visit on 04/19/17 (from the past 24 hour(s))  POCT urine pregnancy     Status: Normal   Collection Time: 04/19/17 12:05 PM  Result Value Ref Range   Preg Test, Ur Negative Negative  POCT hemoglobin     Status: Normal   Collection Time: 04/19/17 12:05 PM  Result Value Ref Range   Hemoglobin 12.3 12.2 -  16.2 g/dL    Assessment: 31 y.o. G1P1001 with dysfunctional uterine bleeding  Plan: Discussed options for treatment including BCPS, progestin, Lysteda. She desires to take BCPS. Explained to take 3 pills/day  x 3 then 2 pills/day x 2 days then one daily x 2 packs of pills. (skip placebo). Given 2 sample packs of Balcoltra. Recommended taking multivitamin daily since at risk for pregnancy and does not want to be on long term birth control. Follow up in 6-8 weeks. Will complete annual at that time.  Farrel Conners, CNM

## 2017-04-22 ENCOUNTER — Emergency Department: Payer: Managed Care, Other (non HMO)

## 2017-04-22 ENCOUNTER — Emergency Department
Admission: EM | Admit: 2017-04-22 | Discharge: 2017-04-22 | Disposition: A | Discharge status: Home / Self Care | Payer: Managed Care, Other (non HMO) | Attending: Emergency Medicine | Admitting: Emergency Medicine

## 2017-04-22 ENCOUNTER — Encounter: Payer: Self-pay | Admitting: Emergency Medicine

## 2017-04-22 DIAGNOSIS — N920 Excessive and frequent menstruation with regular cycle: Secondary | ICD-10-CM | POA: Insufficient documentation

## 2017-04-22 DIAGNOSIS — Z79899 Other long term (current) drug therapy: Secondary | ICD-10-CM | POA: Insufficient documentation

## 2017-04-22 DIAGNOSIS — N938 Other specified abnormal uterine and vaginal bleeding: Secondary | ICD-10-CM | POA: Diagnosis present

## 2017-04-22 LAB — URINALYSIS, COMPLETE (UACMP) WITH MICROSCOPIC
Bacteria, UA: NONE SEEN
Bilirubin Urine: NEGATIVE
Glucose, UA: NEGATIVE mg/dL
Ketones, ur: NEGATIVE mg/dL
Leukocytes, UA: NEGATIVE
NITRITE: NEGATIVE
PH: 5 (ref 5.0–8.0)
PROTEIN: NEGATIVE mg/dL
SPECIFIC GRAVITY, URINE: 1.023 (ref 1.005–1.030)

## 2017-04-22 LAB — CBC WITH DIFFERENTIAL/PLATELET
BASOS PCT: 0 %
Basophils Absolute: 0 10*3/uL (ref 0–0.1)
EOS ABS: 0.1 10*3/uL (ref 0–0.7)
EOS PCT: 1 %
HCT: 36.6 % (ref 35.0–47.0)
HEMOGLOBIN: 13.4 g/dL (ref 12.0–16.0)
LYMPHS ABS: 1.5 10*3/uL (ref 1.0–3.6)
Lymphocytes Relative: 24 %
MCH: 32.3 pg (ref 26.0–34.0)
MCHC: 36.6 g/dL — AB (ref 32.0–36.0)
MCV: 88.2 fL (ref 80.0–100.0)
MONOS PCT: 7 %
Monocytes Absolute: 0.4 10*3/uL (ref 0.2–0.9)
NEUTROS PCT: 68 %
Neutro Abs: 4.3 10*3/uL (ref 1.4–6.5)
PLATELETS: 277 10*3/uL (ref 150–440)
RBC: 4.14 MIL/uL (ref 3.80–5.20)
RDW: 13.5 % (ref 11.5–14.5)
WBC: 6.3 10*3/uL (ref 3.6–11.0)

## 2017-04-22 LAB — POCT PREGNANCY, URINE: Preg Test, Ur: NEGATIVE

## 2017-04-22 MED ORDER — ONDANSETRON 4 MG PO TBDP: 4.0000 mg | ORAL_TABLET | Freq: Four times a day (QID) | ORAL | 0 refills | Status: DC | PRN

## 2017-04-22 MED ORDER — IBUPROFEN 600 MG PO TABS
600.0000 mg | ORAL_TABLET | ORAL | Status: AC
  Administered 2017-04-22: 600 mg via ORAL
  Filled 2017-04-22: qty 1

## 2017-04-22 MED ORDER — ONDANSETRON 4 MG PO TBDP
4.0000 mg | ORAL_TABLET | Freq: Once | ORAL | Status: AC
  Administered 2017-04-22: 4 mg via ORAL
  Filled 2017-04-22: qty 1

## 2017-04-22 NOTE — ED Notes (Signed)
Patient transported to Ultrasound 

## 2017-04-22 NOTE — ED Triage Notes (Signed)
States vaginal bleeding since Monday. Nauseated. Saw MD Wednesday, states had negative urine preg.

## 2017-04-22 NOTE — ED Provider Notes (Addendum)
[facility] Emergency Department Provider Note   ____________________________________________   First MD Initiated Contact with Patient 04/22/17 1739     (approximate)  I have reviewed the triage vital signs and the nursing notes.   HISTORY  Chief Complaint Vaginal Bleeding    HPI Rachael Morris is a 31 y.o. female here for evaluation of vaginal bleeding. Reports he's had bleeding since Monday, fairly heavy the last few days. Denies pregnancy. Saw her doctor 3 days ago and was placed on birth control taper, but after taking the first 3 tablets she reports she started to feel very nauseated and was vomiting that evening. This is improved but she continues to have vaginal spotting and mild discomfort in the left lower abdomen. Denies any fevers or chills. No diarrhea. Mild nausea, but reports vomiting has gone away.  saw [facility] 3 days ago. Had blood work and a pelvicxam at that time  Past Medical History:  Diagnosis Date  . Mitral valve prolapse   . Preeclampsia    w/o severe features    Patient Active Problem List   Diagnosis Date Noted  . Preeclampsia in postpartum period, third trimester 12/01/2015    Past Surgical History:  Procedure Laterality Date  . NO PAST SURGERIES      Prior to Admission medications   Medication Sig Start Date End Date Taking? Authorizing Provider  Levonorgest-Eth Estrad-Fe Bisg (BALCOLTRA) 0.1-20 MG-MCG(21) TABS Take 1 tablet by mouth daily. Start with 3 tabs daily x 3 days then 2 tabs daily x 2 days then one daily. Given 2 sample packs. 04/19/17   Farrel Conners, CNM  ondansetron (ZOFRAN ODT) 4 MG disintegrating tablet Take 1 tablet (4 mg total) by mouth every 6 (six) hours as needed for nausea or vomiting. 04/22/17   Sharyn Creamer, MD    Allergies Codeine  Family History  Problem Relation Age of Onset  . Alcohol abuse Mother   . Cirrhosis Mother   . Hypertension Mother   . Heart disease Maternal  Grandmother   . Lung cancer Maternal Grandfather        mets to bone    Social History Social History  Substance Use Topics  . Smoking status: Never Smoker  . Smokeless tobacco: Never Used  . Alcohol use No    Review of Systems Constitutional: No fever/chills Eyes: No visual changes. ENT: No sore throat. Cardiovascular: Denies chest pain. Respiratory: Denies shortness of breath. Gastrointestinal: mild pain in the left or lower abdomen which she reports is improving from yesterday primarily in the lower area that is crampy in nature  No diarrhea.  No constipation. Genitourinary: Negative for dysuria. Musculoskeletal: Negative for back pain. Skin: Negative for rash. Neurological: Negative for headaches, focal weakness or numbness.    ____________________________________________   PHYSICAL EXAM:  VITAL SIGNS: ED Triage Vitals  Enc Vitals Group     BP 04/22/17 1445 140/85     Pulse Rate 04/22/17 1445 93     Resp 04/22/17 1445 20     Temp 04/22/17 1445 98.6 F (37 C)     Temp Source 04/22/17 1445 Oral     SpO2 04/22/17 1445 95 %     Weight 04/22/17 1446 136 lb (61.7 kg)     Height 04/22/17 1446  (1.575 m)     Head Circumference --      Peak Flow --      Pain Score 04/22/17 1445 7     Pain Loc --  Pain Edu? --      Excl. in GC? --     Constitutional: Alert and oriented. Well appearing and in no acute distress. Eyes: Conjunctivae are normal. Head: Atraumatic. Nose: No congestion/rhinnorhea. Mouth/Throat: Mucous membranes are moist. Neck: No stridor.   Cardiovascular: Normal rate, regular rhythm. Grossly normal heart sounds.  Good peripheral circulation. Respiratory: Normal respiratory effort.  No retractions. Lungs CTAB. Gastrointestinal: Soft and nontenderset for mild tenderness suprapubically and some in the left lower quadrant without rebound or guarding. No pain at McBurney's point. Negative Murphy.. No distention. Musculoskeletal: No lower extremity  tenderness nor edema. Neurologic:  Normal speech and language. No gross focal neurologic deficits are appreciated.  Skin:  Skin is warm, dry and intact. No rash noted. Psychiatric: Mood and affect are normal. Speech and behavior are normal.  ____________________________________________   LABS (all labs ordered are listed, but only abnormal results are displayed)  Labs Reviewed  CBC WITH DIFFERENTIAL/PLATELET - Abnormal; Notable for the following:       Result Value   MCHC 36.6 (*)    All other components within normal limits  URINALYSIS, COMPLETE (UACMP) WITH MICROSCOPIC - Abnormal; Notable for the following:    Color, Urine YELLOW (*)    APPearance CLEAR (*)    Hgb urine dipstick MODERATE (*)    Squamous Epithelial / LPF 0-5 (*)    All other components within normal limits  POC URINE PREG, ED  POCT PREGNANCY, URINE   ____________________________________________  EKG   ____________________________________________  RADIOLOGY  US Transvaginal Non-ob  Result Date: 04/22/2017 CLINICAL DATA:  31 year old female with vaginal bleeding and left pelvic pain for 5 days. EXAM: TRANSABDOMINAL AND TRANSVAGINAL ULTRASOUND OF PELVIS DOPPLER ULTRASOUND OF OVARIES TECHNIQUE: Both transabdominal and transvaginal ultrasound examinations of the pelvis were performed. Transabdominal technique was performed for global imaging of the pelvis including uterus, ovaries, adnexal regions, and pelvic cul-de-sac. It was necessary to proceed with endovaginal exam following the transabdominal exam to visualize the ovaries and endometrium. Color and duplex Doppler ultrasound was utilized to evaluate blood flow to the ovaries. COMPARISON:  None. FINDINGS: Uterus Measurements: 8.8 x 4.4 x 5.4 cm. No fibroids or other mass visualized. Endometrium Thickness: 6 mm.  No focal abnormality visualized. Right ovary Measurements: 2.5 x 2 x 2.2 cm. Normal appearance/no adnexal mass. Left ovary Measurements: 2.8 x 2 x 1.8 cm.  Normal appearance/no adnexal mass. Pulsed Doppler evaluation of both ovaries demonstrates normal low-resistance arterial and venous waveforms. Other findings No abnormal free fluid. IMPRESSION: Unremarkable pelvic ultrasound. Electronically Signed   By: Harmon Pier M.D.   On: 04/22/2017 19:03   US Pelvis Complete  Result Date: 04/22/2017 CLINICAL DATA:  31 year old female with vaginal bleeding and left pelvic pain for 5 days. EXAM: TRANSABDOMINAL AND TRANSVAGINAL ULTRASOUND OF PELVIS DOPPLER ULTRASOUND OF OVARIES TECHNIQUE: Both transabdominal and transvaginal ultrasound examinations of the pelvis were performed. Transabdominal technique was performed for global imaging of the pelvis including uterus, ovaries, adnexal regions, and pelvic cul-de-sac. It was necessary to proceed with endovaginal exam following the transabdominal exam to visualize the ovaries and endometrium. Color and duplex Doppler ultrasound was utilized to evaluate blood flow to the ovaries. COMPARISON:  None. FINDINGS: Uterus Measurements: 8.8 x 4.4 x 5.4 cm. No fibroids or other mass visualized. Endometrium Thickness: 6 mm.  No focal abnormality visualized. Right ovary Measurements: 2.5 x 2 x 2.2 cm. Normal appearance/no adnexal mass. Left ovary Measurements: 2.8 x 2 x 1.8 cm. Normal appearance/no adnexal mass. Pulsed Doppler  evaluation of both ovaries demonstrates normal low-resistance arterial and venous waveforms. Other findings No abnormal free fluid. IMPRESSION: Unremarkable pelvic ultrasound. Electronically Signed   By: Harmon Pier M.D.   On: 04/22/2017 19:03   [place] Art/ven Flow Abd Pelv Doppler  Result Date: 04/22/2017 CLINICAL DATA:  31 year old female with vaginal bleeding and left pelvic pain for 5 days. EXAM: TRANSABDOMINAL AND TRANSVAGINAL ULTRASOUND OF PELVIS DOPPLER ULTRASOUND OF OVARIES TECHNIQUE: Both transabdominal and transvaginal ultrasound examinations of the pelvis were performed. Transabdominal technique was performed  for global imaging of the pelvis including uterus, ovaries, adnexal regions, and pelvic cul-de-sac. It was necessary to proceed with endovaginal exam following the transabdominal exam to visualize the ovaries and endometrium. Color and duplex Doppler ultrasound was utilized to evaluate blood flow to the ovaries. COMPARISON:  None. FINDINGS: Uterus Measurements: 8.8 x 4.4 x 5.4 cm. No fibroids or other mass visualized. Endometrium Thickness: 6 mm.  No focal abnormality visualized. Right ovary Measurements: 2.5 x 2 x 2.2 cm. Normal appearance/no adnexal mass. Left ovary Measurements: 2.8 x 2 x 1.8 cm. Normal appearance/no adnexal mass. Pulsed Doppler evaluation of both ovaries demonstrates normal low-resistance arterial and venous waveforms. Other findings No abnormal free fluid. IMPRESSION: Unremarkable pelvic ultrasound. Electronically Signed   By: Harmon Pier M.D.   On: 04/22/2017 19:03    ____________________________________________   PROCEDURES  Procedure(s) performed: None  Procedures  Critical Care performed: No  ____________________________________________   INITIAL IMPRESSION / ASSESSMENT AND PLAN / ED COURSE  Pertinent labs & imaging results that were available during my care of the patient were reviewed by me and considered in my medical decision making (see chart for details).  patient presents for evaluation of vaginal bleeding. She reports earlier in the week having to use one pad every hour, but reports slowly improving with associated cramping and left lower quadrant pain which is slowly improving as well. She decided to come in for evaluation today as her symptoms have continued to show bleeding and she was not able to take all of her medicine because of the nausea she got afterward  Her abdominal exam is reassuring. She is hemodynamically stable.No infectious symptoms. No peritonitis. Doubt acute abdomen    Hgb stable from check 3 days ago.  pelvic ultrasound unrevealing.  After Zofran she is now eating a [place] cheese steak, reports she feels much better. Pain is well-controlled with ibuprofen. Awake and well oriented. Discussed with Dr. Chauncey Cruel, he recommends having her take 1 of her prescribed birth control tablets daily for the next 3 days. She is agreeable with this plan and will call to set up follow-up on Monday.  Return precautions and treatment recommendations and follow-up discussed with the patient who is agreeable with the plan.   ____________________________________________   FINAL CLINICAL IMPRESSION(S) / ED DIAGNOSES  Final diagnoses:  Menorrhagia with regular cycle      NEW MEDICATIONS STARTED DURING THIS VISIT:  Discharge Medication List as of 04/22/2017  8:44 PM    START taking these medications   Details  ondansetron (ZOFRAN ODT) 4 MG disintegrating tablet Take 1 tablet (4 mg total) by mouth every 6 (six) hours as needed for nausea or vomiting., Starting Sat 04/22/2017, Print         Note:  This document was prepared using Dragon voice recognition software and may include unintentional dictation errors.     Sharyn Creamer, MD 04/22/17 1610    Sharyn Creamer, MD 04/22/17 2141

## 2017-04-22 NOTE — Discharge Instructions (Signed)
Please follow up closely with obstetrics and gynecology or your primary doctor.  Return to the emergency room if your bleeding worsens, you become weak and dizzy or lightheaded, you have an episode of passing out, develop severe bleeding such as more than 1 soaked pad per hour for more than 3 straight hours, develop abdominal or pelvic pain, fevers chills or other new concerns arise.   

## 2017-04-24 ENCOUNTER — Encounter: Payer: Self-pay | Admitting: Certified Nurse Midwife

## 2017-04-24 ENCOUNTER — Ambulatory Visit (INDEPENDENT_AMBULATORY_CARE_PROVIDER_SITE_OTHER): Payer: Managed Care, Other (non HMO) | Admitting: Certified Nurse Midwife

## 2017-04-24 ENCOUNTER — Telehealth: Payer: Self-pay

## 2017-04-24 VITALS — BP 110/70 | HR 89 | Ht 62.0 in | Wt 138.0 lb

## 2017-04-24 DIAGNOSIS — R1032 Left lower quadrant pain: Secondary | ICD-10-CM

## 2017-04-24 DIAGNOSIS — N939 Abnormal uterine and vaginal bleeding, unspecified: Secondary | ICD-10-CM | POA: Diagnosis not present

## 2017-04-24 MED ORDER — IBUPROFEN 600 MG PO TABS: 600.0000 mg | ORAL_TABLET | Freq: Four times a day (QID) | ORAL | 1 refills | Status: DC | PRN

## 2017-04-24 NOTE — Progress Notes (Signed)
  History of Present Illness:  Rachael Morris is a 31 y.o. who was started on a BCP taper Current Outpatient Prescriptions on File Prior to Visit  Medication Sig Dispense Refill  . Levonorgest-Eth Estrad-Fe Bisg (BALCOLTRA) 0.1-20 MG-MCG(21) TABS Take 1 tablet by mouth daily. Start with 3 tabs daily x 3 days then 2 tabs daily x 2 days then one daily. Given 2 sample packs. 56 tablet 0  . ondansetron (ZOFRAN ODT) 4 MG disintegrating tablet Take 1 tablet (4 mg total) by mouth every 6 (six) hours as needed for nausea or vomiting. (Patient not taking: Reported on 04/24/2017) 20 tablet 0   No current facility-administered medications on file prior to visit.    approximately 5 days ago for an episode of DUB. Marland Kitchen She was unable to tolerate the BCPs and began vomiting. Her heavy bleeding persisted and she went to the ER on 04/21/2017 since she was changing a tampon every 30 minutes. She was also having cramping in the LLQ. A pelvic ultrasound was done and  was normal. SHe was treated with Zofran 4 mgm after which she was able to tolerate a regular diet. She restarted the OCPs last night taking one pill a day and reports that her bleeding is not as bad today. Cramping responded to ibuprofen in the ER and she would like a RX. ES Tylenol did not relieve cramping.   PMHx: She  has a past medical history of Mitral valve prolapse and Preeclampsia. Also,  has a past surgical history that includes No past surgeries., family history includes Alcohol abuse in her mother; Cirrhosis in her mother; Heart disease in her maternal grandmother; Hypertension in her mother; Lung cancer in her maternal grandfather.,  reports that she has never smoked. She has never used smokeless tobacco. She reports that she does not drink alcohol or use drugs.  She has a current medication list which includes the following prescription(s): levonorgest-eth estrad-fe bisg, ibuprofen, and ondansetron. Also, is allergic to codeine.  Review of Systems   Constitutional: Negative for chills and fever.  Respiratory: Negative for cough.   Cardiovascular: Negative for chest pain.  Gastrointestinal: Positive for abdominal pain (LLQ pain), nausea and vomiting (not since 9/15 AM).  Genitourinary:       See HPI  Skin: Negative for rash.  Neurological: Positive for weakness. Negative for headaches.  Psychiatric/Behavioral: The patient is nervous/anxious (feels stressed recently regarding care of mother).     Physical Exam:  BP 110/70   Pulse 89   Ht  (1.575 m)   Wt 138 lb (62.6 kg)   LMP 04/17/2017 (Exact Date)   BMI 25.24 kg/m  Body mass index is 25.24 kg/m. Constitutional: Well nourished, well developed female in no acute distress.  Neuro: Grossly intact Psych:  Normal mood and affect.    Assessment: Follow up on dysfunctional uterine bleeding-decreased bleeding on OCPs Nausea from OCPS-better on daily pill and with Zofran4 mgm Cramping/ LLQ pain-no evidence of mass on ultrasound-relieved with ibuprofen  Plan: Patient wants to remain on the OCPs this pack Ibuprofen 600 mgm q6h prn pain Recommend colace with Zofran to decrease constipating effects Follow up in November for annual and prn  Farrel Conners, CNM  Annamarie Major, MD, [facility], [facility] 04/24/2017  2:47 PM

## 2017-04-24 NOTE — Telephone Encounter (Signed)
Pt called the after hour nurse 9./14/18 at 12:09pm c/o abnl bleeding since Monday, was seen by CLG Wed and given bcp taper which made her sick.  Was adv by on call, CLG, to go to ED since bleeding is heavy - changing q29min-1hr.  Was seen in ED, evaluated, d/c to f/u c [place].  Called pt this am, states bleeding is a little better, still cramping.  Tx'd to [facility] to sched f/u appt.

## 2017-06-14 ENCOUNTER — Ambulatory Visit (INDEPENDENT_AMBULATORY_CARE_PROVIDER_SITE_OTHER): Payer: Managed Care, Other (non HMO) | Admitting: Certified Nurse Midwife

## 2017-06-14 ENCOUNTER — Encounter: Payer: Self-pay | Admitting: Certified Nurse Midwife

## 2017-06-14 VITALS — BP 110/70 | HR 85 | Ht 62.0 in | Wt 133.0 lb

## 2017-06-14 DIAGNOSIS — Z23 Encounter for immunization: Secondary | ICD-10-CM | POA: Diagnosis not present

## 2017-06-14 DIAGNOSIS — N939 Abnormal uterine and vaginal bleeding, unspecified: Secondary | ICD-10-CM | POA: Diagnosis not present

## 2017-06-14 DIAGNOSIS — Z01419 Encounter for gynecological examination (general) (routine) without abnormal findings: Secondary | ICD-10-CM

## 2017-06-14 DIAGNOSIS — Z30011 Encounter for initial prescription of contraceptive pills: Secondary | ICD-10-CM | POA: Diagnosis not present

## 2017-06-14 DIAGNOSIS — Z124 Encounter for screening for malignant neoplasm of cervix: Secondary | ICD-10-CM

## 2017-06-14 LAB — POCT URINE PREGNANCY: PREG TEST UR: NEGATIVE

## 2017-06-14 MED ORDER — NORETHINDRONE 0.35 MG PO TABS: 1.0000 | ORAL_TABLET | Freq: Every day | ORAL | 11 refills | Status: DC

## 2017-06-16 LAB — IGP,RFX APTIMA HPV ALL PTH: PAP SMEAR COMMENT: 0

## 2017-06-17 NOTE — Progress Notes (Signed)
Gynecology Annual Exam  PCP: Fayrene HelperBoswell, Chelsa H, NP  Chief Complaint:  Chief Complaint  Patient presents with  . Follow-up    f/u AUB    History of Present Illness: Rachael Morris is a 31 y.o. G1P1001 who presents for and follow up and her annual exam. The patient was seen in september for an episode of DUB and was placed on BCPs to control bleeding. She had nausea and vomiting on the BCPs was was able to keep them down with the help of Zofran. She finished the pills and had a menses 05/22/2017 which lasted 9 days. She then spotted 2 days ago x 1 day. She is currently using withdrawal for contraception. Does not want to conceive at this time.    Last pap smear: 01/15/2016, results were NIL  Her past medical history is remarkable for a history of preeclampsia and MVP (not currently on any meds).  The patient does perform self breast exams. Her last mammogram was NA.Marland Kitchen.   There is a family history of breast cancer in her MGGM. Genetic testing is not indicated  There is no family history of ovarian cancer.  The patient denies smoking.  She drinks alcohol occasionally   She denies illegal drug use.  The patient reports exercising occasionally.  The patient denies current symptoms of depression.    Review of Systems: Review of Systems  Constitutional: Negative for chills, fever and weight loss.  HENT: Negative for congestion, sinus pain and sore throat.   Eyes: Negative for blurred vision and pain.  Respiratory: Negative for hemoptysis, shortness of breath and wheezing.   Cardiovascular: Negative for chest pain, palpitations and leg swelling.  Gastrointestinal: Negative for abdominal pain, blood in stool, diarrhea, heartburn, nausea and vomiting.  Genitourinary: Negative for dysuria, frequency, hematuria and urgency.  Musculoskeletal: Negative for back pain, joint pain and myalgias.  Skin: Negative for itching and rash.  Neurological: Negative for dizziness, tingling and  headaches.  Endo/Heme/Allergies: Negative for environmental allergies and polydipsia. Does not bruise/bleed easily.       Negative for hirsutism   Psychiatric/Behavioral: Negative for depression. The patient is not nervous/anxious and does not have insomnia.     Past Medical History:  Past Medical History:  Diagnosis Date  . Mitral valve prolapse   . Preeclampsia    w/o severe features    Past Surgical History:  Past Surgical History:  Procedure Laterality Date  . NO PAST SURGERIES      Family History:  Family History  Problem Relation Age of Onset  . Alcohol abuse Mother   . Cirrhosis Mother   . Hypertension Mother   . Heart disease Maternal Grandmother   . Lung cancer Maternal Grandfather        mets to bone    Social History:  Social History   Socioeconomic History  . Marital status: Married    Spouse name: Not on file  . Number of children: 1  . Years of education: Not on file  . Highest education level: Not on file  Social Needs  . Financial resource strain: Not on file  . Food insecurity - worry: Not on file  . Food insecurity - inability: Not on file  . Transportation needs - medical: Not on file  . Transportation needs - non-medical: Not on file  Occupational History  . Not on file  Tobacco Use  . Smoking status: Never Smoker  . Smokeless tobacco: Never Used  Substance and Sexual Activity  .  Alcohol use: No  . Drug use: No  . Sexual activity: Yes    Partners: Male    Birth control/protection: None  Other Topics Concern  . Not on file  Social History Narrative  . Not on file    Allergies:  Allergies  Allergen Reactions  . Codeine Other (See Comments)    Mood swings?  nausea    Medications: none Physical Exam Vitals: BP 110/70   Pulse 85   Ht 5\' 2"  (1.575 m)   Wt 133 lb (60.3 kg)   LMP 05/22/2017 (Exact Date)   BMI 24.33 kg/m .  General: NAD HEENT: normocephalic, anicteric Neck: no thyroid enlargement, no palpable nodules, no  cervical lymphadenopathy  Pulmonary: No increased work of breathing, CTAB Cardiovascular: RRR, without murmur  Breast: Breast symmetrical, no tenderness, no palpable nodules or masses, no skin or nipple retraction present, no nipple discharge.  No axillary, infraclavicular or supraclavicular lymphadenopathy. Abdomen: Soft, non-tender, non-distended.  Umbilicus without lesions.  No hepatomegaly or masses palpable. No evidence of hernia. Genitourinary:  External: Normal external female genitalia.  Normal urethral meatus, normal Bartholin's and Skene's glands.    Vagina: Normal vaginal mucosa, no evidence of prolapse.    Cervix: friable cervix vs menstrual bleeding,  non-tender  Uterus: Midplane, normal size, shape, and consistency, mobile, and non-tender  Adnexa: No adnexal masses, non-tender  Rectal: deferred  Lymphatic: no evidence of inguinal lymphadenopathy Extremities: no edema, erythema, or tenderness Neurologic: Grossly intact Psychiatric: mood appropriate, affect full     Assessment: 31 y.o. G1P1001 annual gyn exam Follow up on [facility]UB  Plan:    1) Breast cancer screening - recommend monthly self breast exam.   2) Cervical cancer screening - Pap was done.   3) Contraception - Education given regarding options for contraception without estrogen that may be better tolerated. She would like to try POPs. Discussed how to take pills and 3 hour rule. RX for Camila sent to pharmacy.  4) Flu vaccine today.  5) Follow up in 1 year and prn   Farrel Connersolleen Enrika Aguado, CNM

## 2017-06-19 ENCOUNTER — Encounter: Payer: Self-pay | Admitting: Certified Nurse Midwife

## 2017-08-15 ENCOUNTER — Ambulatory Visit: Payer: Self-pay | Admitting: Emergency Medicine

## 2017-08-15 ENCOUNTER — Encounter: Payer: Self-pay | Admitting: Emergency Medicine

## 2017-08-15 VITALS — BP 109/60 | HR 75 | Temp 98.3°F | Resp 16

## 2017-08-15 DIAGNOSIS — Z9189 Other specified personal risk factors, not elsewhere classified: Secondary | ICD-10-CM

## 2017-08-15 LAB — POCT INFLUENZA A/B
Influenza A, POC: NEGATIVE
Influenza B, POC: NEGATIVE

## 2017-08-15 LAB — POCT RAPID STREP A (OFFICE): RAPID STREP A SCREEN: NEGATIVE

## 2017-08-15 MED ORDER — OSELTAMIVIR PHOSPHATE 75 MG PO CAPS: ORAL_CAPSULE | ORAL | 0 refills | Status: DC

## 2017-08-15 NOTE — Addendum Note (Signed)
Addended by: Catha BrowEACON, Mercie Balsley T on: 08/15/2017 03:06 PM   Modules accepted: Orders

## 2017-08-15 NOTE — Progress Notes (Signed)
Subjective. Daughter with strep and flu. Patient awakened this morning with myalgias fatigue and a dry cough. No sore throat. She took her temperature this morning and it was normal. Review of systems. Noncontributory as relates to present illness Objective. Ill not toxic. HEENT exam. Unremarkable. Neck supple. No adenopathy. Chest clear. Heart regular rate and rhythm. Assessment. Strep and flu exposure. Plan Check strep and flu tests. Treat with Tamiflu 75 twice a day for patient and husband.

## 2017-10-23 ENCOUNTER — Encounter: Payer: Self-pay | Admitting: Family Medicine

## 2017-10-23 ENCOUNTER — Ambulatory Visit: Payer: Self-pay | Admitting: Family Medicine

## 2017-10-23 VITALS — BP 120/92 | HR 89 | Temp 98.2°F | Resp 20

## 2017-10-23 DIAGNOSIS — R062 Wheezing: Secondary | ICD-10-CM

## 2017-10-23 DIAGNOSIS — R059 Cough, unspecified: Secondary | ICD-10-CM

## 2017-10-23 DIAGNOSIS — J069 Acute upper respiratory infection, unspecified: Secondary | ICD-10-CM

## 2017-10-23 DIAGNOSIS — R05 Cough: Secondary | ICD-10-CM

## 2017-10-23 MED ORDER — BENZONATATE 100 MG PO CAPS: 100.0000 mg | ORAL_CAPSULE | Freq: Three times a day (TID) | ORAL | 0 refills | Status: DC | PRN

## 2017-10-23 MED ORDER — ALBUTEROL SULFATE HFA 108 (90 BASE) MCG/ACT IN AERS: 2.0000 | INHALATION_SPRAY | Freq: Four times a day (QID) | RESPIRATORY_TRACT | 0 refills | Status: DC | PRN

## 2017-10-23 NOTE — Progress Notes (Signed)
Subjective: congestion     Rachael Morris is a 32 y.o. female who presents for evaluation of nonproductive cough, nasal congestion, sore throat, ear pain, nausea, headache, and wheezing in the morning since Friday.  Patient reports that she sometimes has to use an inhaler when she gets an upper respiratory infection but does not believe she has asthma.  Denies COPD or smoking history.  Reports getting approximately one sinus infection each year.  Denies frequent lung infections.  Denies allergic rhinitis.  Denies fever, chills, rash, vomiting, diarrhea, shortness of breath, chest or back pain, difficulty swallowing, confusion, body aches, fatigue, severe symptoms, or initial improvement and then worsening of symptoms. Treatment to date: Tylenol cold and sinus.   Review of Systems Pertinent items noted in HPI and remainder of comprehensive ROS otherwise negative.     Objective:   Physical Exam General: Awake, alert, and oriented. No acute distress. Well developed, hydrated and nourished. Appears stated age. Nontoxic appearance.  HEENT:  PND noted.  Mild erythema to posterior oropharynx.  No edema or exudates of pharynx or tonsils. No erythema or bulging of TM.  Mild erythema/edema to nasal mucosa. Sinuses nontender.  Patient endorses pressure just beneath both of her eyes bilaterally but not over maxillary sinuses.  No periorbital/orbital erythema or edema.  Supple neck without adenopathy. Cardiac: Heart rate and rhythm are normal. No murmurs, gallops, or rubs are auscultated. S1 and S2 are heard and are of normal intensity.  Respiratory: No signs of respiratory distress. Lungs clear. No tachypnea. Able to speak in full sentences without dyspnea. Nonlabored respirations.  Skin: Skin is warm, dry and intact. Appropriate color for ethnicity. No cyanosis noted.   Diagnostic Results: None.  Assessment:    viral upper respiratory illness   Plan:    Discussed diagnosis and treatment of  URI. Discussed the importance of avoiding unnecessary antibiotic therapy. Suggested symptomatic OTC remedies. Nasal saline spray for congestion.  Discussed red flag symptoms and circumstances with which to return to care.   New Prescriptions   ALBUTEROL (PROVENTIL HFA;VENTOLIN HFA) 108 (90 BASE) MCG/ACT INHALER    Inhale 2 puffs into the lungs every 6 (six) hours as needed for wheezing or shortness of breath.   BENZONATATE (TESSALON) 100 MG CAPSULE    Take 1 capsule (100 mg total) by mouth 3 (three) times Morris as needed for cough (don't supress productive cough, don't use regularly).

## 2018-01-31 ENCOUNTER — Ambulatory Visit: Payer: Self-pay | Admitting: Family Medicine

## 2018-01-31 VITALS — BP 130/77 | HR 83 | Resp 20 | Ht 62.0 in | Wt 134.0 lb

## 2018-01-31 DIAGNOSIS — Z0189 Encounter for other specified special examinations: Principal | ICD-10-CM

## 2018-01-31 DIAGNOSIS — Z008 Encounter for other general examination: Secondary | ICD-10-CM

## 2018-01-31 NOTE — Progress Notes (Signed)
Subjective: Annual biometrics screening labs Patient presents for her annual biometric screening labs only.  Patient already had a physical exam with her OB/GYN this year.  Patient reports eating a fairly healthy, well-rounded diet and getting regular physical activity.  Patient does not regularly see a primary care provider but uses her OB/GYN for this. PCP: None currently. Patient denies any other issues or concerns.    Assessment Annual biometrics screening labs  Plan  Lipid panel and fasting blood sugar pending. Encouraged routine visits with primary care provider.  Provided patient with a list of local resources and encouraged her to establish care with primary care provider. Encouraged patient to get regular exercise and eat a healthy, well-rounded diet.

## 2018-02-01 LAB — LIPID PANEL
CHOL/HDL RATIO: 4.8 ratio — AB (ref 0.0–4.4)
Cholesterol, Total: 213 mg/dL — ABNORMAL HIGH (ref 100–199)
HDL: 44 mg/dL (ref >39)
LDL CALC: 140 mg/dL — AB (ref 0–99)
TRIGLYCERIDES: 145 mg/dL (ref 0–149)
VLDL Cholesterol Cal: 29 mg/dL (ref 5–40)

## 2018-02-01 LAB — GLUCOSE, RANDOM: GLUCOSE: 70 mg/dL (ref 65–99)

## 2018-02-01 NOTE — Progress Notes (Signed)
Dear [name], I wanted to let you know that your lipid panel and fasting blood sugar came back.  Everything is normal, with the exception of your total cholesterol, LDL cholesterol, and cholesterol/HDL ratio.  Your total cholesterol is elevated at 213, normal values are between 100 and 199.   Your LDL cholesterol ("bad cholesterol") is elevated at 140, normal values are below 99.  Your cholesterol/HDL ratio is elevated at 4.8, normal values are between 0 and 4.4 for women or 0 and 5 from men.  These abnormal values increase your risk for cardiovascular disease.  I want you to follow-up with your primary care provider regarding these results.

## 2018-03-06 ENCOUNTER — Ambulatory Visit: Payer: Self-pay | Admitting: Physician Assistant

## 2018-03-06 VITALS — BP 126/88 | HR 80 | Temp 98.0°F | Resp 18

## 2018-03-06 DIAGNOSIS — N912 Amenorrhea, unspecified: Secondary | ICD-10-CM

## 2018-03-06 DIAGNOSIS — L308 Other specified dermatitis: Secondary | ICD-10-CM

## 2018-03-06 DIAGNOSIS — R300 Dysuria: Secondary | ICD-10-CM

## 2018-03-06 LAB — POCT URINALYSIS DIPSTICK
Bilirubin, UA: NEGATIVE
Glucose, UA: NEGATIVE
Ketones, UA: NEGATIVE
LEUKOCYTES UA: NEGATIVE
Nitrite, UA: NEGATIVE
PH UA: 5.5 (ref 5.0–8.0)
Protein, UA: NEGATIVE
RBC UA: NEGATIVE
UROBILINOGEN UA: 0.2 U/dL

## 2018-03-06 LAB — POCT URINE PREGNANCY: PREG TEST UR: NEGATIVE

## 2018-03-06 MED ORDER — FLUOCINONIDE 0.05 % EX OINT: TOPICAL_OINTMENT | CUTANEOUS | 0 refills | Status: DC

## 2018-03-06 NOTE — Progress Notes (Signed)
S: 32 year old female presents with 2-1/2 month history of rash on left hand. Located at palmar aspect of 4th MCP with extension to proximal phalanx and ulnar aspect of 4th proximal phalanx and finger webbing between 4th and 5th finger. Itchy. Dry. Cracked. Has not spread, stayed in same location. Will occasionally develop small blisters, filled with clear fluid. Has applied hydrogen peroxide, cocoa butter, Nivea lotion, Neosporin, coconut oil, and vaseline with no improvement. No known triggering factor; finger that patient wears her wedding ring on. Has not been wearing since development of rash. No previous history of similar rash. No personal history of eczema; 30 year old child has eczema.  Patient also with five day history of urinary symptoms. Reports dysuria, mild, at end of urinary stream. Mild increased urinary frequency. And lower abdominal cramping, pressure, fullness. Patient states symptoms more severe over the weekend and then lessening these past two days. Patient has increased fluids and been drinking cranberry juice with symptom improvement. Patient with history of irregular menses. Not currently on birth control. Patient admits to possibility of being pregnant. LMP two months ago. Patient is a monogamous relationship with her husband. Patient denies concern for STD. Patient denies fever, chills, abdominal pain, nausea/vomiting, vaginal rash/discharge.  O: VSS. Patient sitting comfortably on examination table. In no acute distress. Left hand inspection reveals 2cm circular area of dry, erythematous skin on palmar aspect of 4th MCP. Multiple cracks. No active bleeding. No visible blisters/pustules at this time. No tenderness with palpation. Evidence of excoriation and skin lichenification.   Urinalysis WNL. No leukocytes, nitrites, or hematuria.   Urine pregnancy test negative.  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  If no improvement in 1-2 weeks, recommend further evaluation by dermatologist.  Dysuria and amenorrhea  Sent urine for culture. Will call patient with results. If positive, treat accordingly. If negative but patient's symptoms persist, may wish to treat with 3 day course of Bactrim or Cipro regardless. Patient may also be advised to follow-up with Ob/Gyn.

## 2018-03-08 LAB — URINE CULTURE

## 2018-08-14 ENCOUNTER — Ambulatory Visit: Payer: Self-pay | Admitting: Emergency Medicine

## 2018-08-14 ENCOUNTER — Encounter: Payer: Self-pay | Admitting: Emergency Medicine

## 2018-08-14 VITALS — BP 122/80 | HR 105 | Temp 98.5°F | Resp 14

## 2018-08-14 DIAGNOSIS — J209 Acute bronchitis, unspecified: Secondary | ICD-10-CM

## 2018-08-14 DIAGNOSIS — R05 Cough: Secondary | ICD-10-CM

## 2018-08-14 DIAGNOSIS — R059 Cough, unspecified: Secondary | ICD-10-CM

## 2018-08-14 DIAGNOSIS — J0100 Acute maxillary sinusitis, unspecified: Secondary | ICD-10-CM

## 2018-08-14 LAB — POCT INFLUENZA A/B
Influenza A, POC: NEGATIVE
Influenza B, POC: NEGATIVE

## 2018-08-14 MED ORDER — AMOXICILLIN 875 MG PO TABS: ORAL_TABLET | ORAL | 0 refills | Status: DC

## 2018-08-14 NOTE — Progress Notes (Signed)
[facility]   Patient ID: Rachael Morris DOB: 33 y.o. MRN: [id number]   Subjective: 3 to 4 days of worsening URI symptoms.  Started as fatigue and sinus congestion, progressed to discolored rhinorrhea and facial pain, now cough productive of yellow sputum and low-grade fever.  Minimal myalgias but no severe myalgias/arthralgias.  Denies sore throat.  No chest pain or shortness of breath, but she feels mild wheezing at times, has albuterol HFA at home for rescue inhaler, and that has helped wheezing.  She states she had exercise-induced asthma as a teenager and she does not currently have a diagnosis of asthma but occasionally gets wheezing when she gets URI/bronchitis. She denies chance of pregnancy.  LMP was 2 to 3 months ago, but she states her periods are always irregular and she did a home pregnancy test 2 days ago which was negative. She declines repeating pregnancy test today.  Pertinent items noted in HPI and remainder of comprehensive ROS otherwise negative.   Objective: Blood pressure 122/80, pulse (!) 105, temperature 98.5 F (36.9 C), temperature source Oral, resp. rate 14, SpO2 98 %.  In general, she is fatigued but not toxic appearing.  No acute distress. TMs normal Nose with boggy turbinates and seromucoid drainage. Mild bilateral maxillary sinus tenderness. Oropharynx is clear without redness.  Minimal seromucoid postnasal drainage. Neck supple minimally enlarged shotty, tender anterior cervical nodes. Lungs, few anterior rhonchi.  Breath sounds equal bilaterally.  Good expansion bilaterally.  No definite wheezes heard.  No rales.  Pulse ox 98% on room air. Skin: No rash  In our office, rapid flu test negative today.   Assessment: Likely has acute sinusitis and bronchitis.  By her history and exam, this may have started viral URI, but I suspect bacterial component.    Plan:  Treatment options discussed, as well as risks,  benefits, alternatives. Patient voiced understanding and agreement with the following plans:  New Prescriptions   AMOXICILLIN (AMOXIL) 875 MG TABLET    Take 1 twice a day X 10 days.  Other OTC symptomatic care discussed, such as Mucinex.  Decongestant OTC but caution as it may elevate pulse or BP. Use albuterol HFA every 4 to 6 hours if needed for wheezing.-Note that she has no wheezing on my physical exam today, and in my opinion, prednisone would not be indicated at this point, but advised her that she might need prednisone if wheezing worsened. Rest and push fluids. She declined any written information or AVS. Work note written by hand. Follow-up with your primary care doctor in 5-7 days if not improving, or sooner if symptoms become worse. Precautions discussed. Red flags discussed.-Advice given to see physician immediately if any red flags or worrisome or worsening symptoms. Questions invited and answered. Patient voiced understanding and agreement.

## 2018-09-16 ENCOUNTER — Other Ambulatory Visit: Payer: Self-pay

## 2018-09-16 ENCOUNTER — Ambulatory Visit
Admission: EM | Admit: 2018-09-16 | Discharge: 2018-09-16 | Disposition: A | Discharge status: Home / Self Care | Payer: Managed Care, Other (non HMO) | Attending: Family Medicine | Admitting: Family Medicine

## 2018-09-16 DIAGNOSIS — R69 Illness, unspecified: Secondary | ICD-10-CM

## 2018-09-16 DIAGNOSIS — H6692 Otitis media, unspecified, left ear: Secondary | ICD-10-CM

## 2018-09-16 DIAGNOSIS — J111 Influenza due to unidentified influenza virus with other respiratory manifestations: Secondary | ICD-10-CM

## 2018-09-16 DIAGNOSIS — R0981 Nasal congestion: Secondary | ICD-10-CM

## 2018-09-16 MED ORDER — AMOXICILLIN 875 MG PO TABS: 875.0000 mg | ORAL_TABLET | Freq: Two times a day (BID) | ORAL | 0 refills | Status: DC

## 2018-09-16 MED ORDER — OSELTAMIVIR PHOSPHATE 75 MG PO CAPS: 75.0000 mg | ORAL_CAPSULE | Freq: Two times a day (BID) | ORAL | 0 refills | Status: DC

## 2018-09-16 NOTE — ED Triage Notes (Signed)
Pt with left sided otalgia, yellow and bloody drainage from nose, headache and facial pressure.

## 2018-09-16 NOTE — Discharge Instructions (Signed)
Take medication as prescribed. Rest. Drink plenty of fluids. Tylenol and ibuprofen as needed.  ° °Follow up with your primary care physician this week as needed. Return to Urgent care for new or worsening concerns.  ° °

## 2018-09-16 NOTE — ED Provider Notes (Signed)
[facility] ____________________________________________  Time seen: Approximately 4:07 PM  I have reviewed the triage vital signs and the nursing notes.   HISTORY  Chief Complaint Sinus Problem   HPI Rachael Morris is a 33 y.o. female presenting for evaluation of 2 days of cough, congestion and fever.  Reports T-max last night was 101.  Reports she did have some cough a few days ago but has gotten better.  Reports in the last 2 days generalized body aches and feels tired.  Also reports since yesterday having a lot of left ear pain and decreased hearing.  States aching throbbing left ear pain, currently moderate.  Some sore throat.  Overall continues to eat and drink.  Has taken some over-the-counter Tylenol, ibuprofen and cough congestion medicines.  Does report her brother and her daughter both tested positive for influenza B this week.  Reports some soreness for coughing, denies other chest pain.  No shortness of breath.  Denies recent sickness.  Denies current pregnancy.    Past Medical History:  Diagnosis Date  . Mitral valve prolapse   . Preeclampsia    w/o severe features    Patient Active Problem List   Diagnosis Date Noted  . Preeclampsia in postpartum period, third trimester 12/01/2015    Past Surgical History:  Procedure Laterality Date  . NO PAST SURGERIES       No current facility-administered medications for this encounter.   Current Outpatient Medications:  .  amoxicillin (AMOXIL) 875 MG tablet, Take 1 tablet (875 mg total) by mouth 2 (two) times daily., Disp: 20 tablet, Rfl: 0 .  oseltamivir (TAMIFLU) 75 MG capsule, Take 1 capsule (75 mg total) by mouth every 12 (twelve) hours., Disp: 10 capsule, Rfl: 0  Allergies Codeine  Family History  Problem Relation Age of Onset  . Alcohol abuse Mother   . Cirrhosis Mother   . Hypertension Mother   . Cervical cancer Mother   . Heart disease Maternal Grandmother   . Hypertension Maternal  Grandmother   . Lung cancer Maternal Grandfather        mets to bone  . Breast cancer Other     Social History Social History   Tobacco Use  . Smoking status: Never Smoker  . Smokeless tobacco: Never Used  Substance Use Topics  . Alcohol use: Yes    Comment: social  . Drug use: No    Review of Systems Constitutional: Positive fever ENT: Positive sore throat and left ear pain.  Positive congestion. Cardiovascular: Denies chest pain. Respiratory: Denies shortness of breath. Gastrointestinal: No abdominal pain.  Musculoskeletal: Negative for back pain. ____________________________________________   PHYSICAL EXAM:  VITAL SIGNS: ED Triage Vitals  Enc Vitals Group     BP 09/16/18 1233 (!) 117/95     Pulse Rate 09/16/18 1233 (!) 110     Resp 09/16/18 1233 16     Temp 09/16/18 1233 98.7 F (37.1 C)     Temp Source 09/16/18 1233 Oral     SpO2 09/16/18 1233 99 %     Weight 09/16/18 1234 135 lb (61.2 kg)     Height 09/16/18 1234 5\' 2"  (1.575 m)     Head Circumference --      Peak Flow --      Pain Score 09/16/18 1234 4     Pain Loc --      Pain Edu? --      Excl. in GC? --     Constitutional: Alert and oriented.  Well appearing and in no acute distress. Eyes: Conjunctivae are normal.  Head: Atraumatic. No sinus tenderness to palpation. No swelling. No erythema.  Ears: Left: Mild tenderness with auricle movement, normal canal, moderate erythema bulging TM.  Right: Nontender, normal canal, no erythema, normal TM.  Nose:Nasal congestion   Mouth/Throat: Mucous membranes are moist. Mild pharyngeal erythema. No tonsillar swelling or exudate.  Neck: No stridor.  No cervical spine tenderness to palpation. Hematological/Lymphatic/Immunilogical: No cervical lymphadenopathy. Cardiovascular: Normal rate, regular rhythm. Grossly normal heart sounds.  Good peripheral circulation. Respiratory: Normal respiratory effort.  No retractions. No wheezes, rales or rhonchi. Good air movement.   Musculoskeletal: Ambulatory with steady gait. No cervical, thoracic or lumbar tenderness to palpation. Neurologic:  Normal speech and language. No gait instability. Skin:  Skin appears warm, dry and intact. No rash noted. Psychiatric: Mood and affect are normal. Speech and behavior are normal. ___________________________________________   LABS (all labs ordered are listed, but only abnormal results are displayed)  Labs Reviewed - No data to display ____________________________________________   PROCEDURES Procedures     INITIAL IMPRESSION / ASSESSMENT AND PLAN / ED COURSE  Pertinent labs & imaging results that were available during my care of the patient were reviewed by me and considered in my medical decision making (see chart for details).  Overall well-appearing patient.  No acute distress.  Suspect influenza.  Discussed use of Tamiflu, Rx given.  Continue over-the-counter Tylenol and ibuprofen.  Patient also with left otitis.  Will treat with oral amoxicillin.  Encourage rest, fluids, supportive care.  Work note given.Discussed indication, risks and benefits of medications with patient.  Discussed follow up with Primary care physician this week. Discussed follow up and return parameters including no resolution or any worsening concerns. Patient verbalized understanding and agreed to plan.   ____________________________________________   FINAL CLINICAL IMPRESSION(S) / ED DIAGNOSES  Final diagnoses:  Influenza-like illness  Left otitis media, unspecified otitis media type     ED Discharge Orders         Ordered    amoxicillin (AMOXIL) 875 MG tablet  2 times daily     09/16/18 1544    oseltamivir (TAMIFLU) 75 MG capsule  Every 12 hours     09/16/18 1544           Note: This dictation was prepared with Dragon dictation along with smaller phrase technology. Any transcriptional errors that result from this process are unintentional.         Renford DillsMiller, Xxavier Noon,  NP 09/16/18 1610

## 2018-10-30 ENCOUNTER — Telehealth: Payer: Managed Care, Other (non HMO) | Admitting: Family

## 2018-10-30 DIAGNOSIS — R05 Cough: Secondary | ICD-10-CM

## 2018-10-30 DIAGNOSIS — R059 Cough, unspecified: Secondary | ICD-10-CM

## 2018-10-30 MED ORDER — PROMETHAZINE-DM 6.25-15 MG/5ML PO SYRP: 5.0000 mL | ORAL_SOLUTION | Freq: Four times a day (QID) | ORAL | 0 refills | Status: DC | PRN

## 2018-10-30 NOTE — Progress Notes (Signed)

## 2018-11-17 IMAGING — US US TRANSVAGINAL NON-OB
1 series · 14 of 25 positions shown · non-contrast
Comparison: None.

CLINICAL DATA: 31-year-old female with vaginal bleeding and left
pelvic pain for 5 days.



[Series 1: us transvaginal non-ob · 0.18mm/px · 14 of 114 slices shown]
[im 1/114]
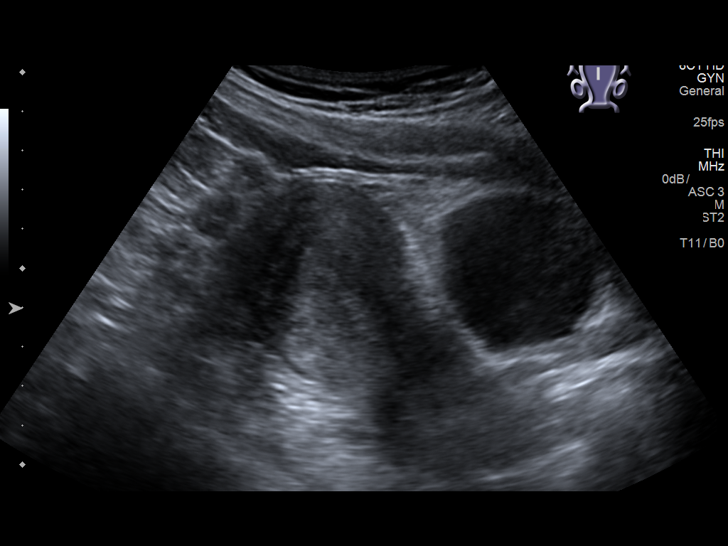
[im 10/114]
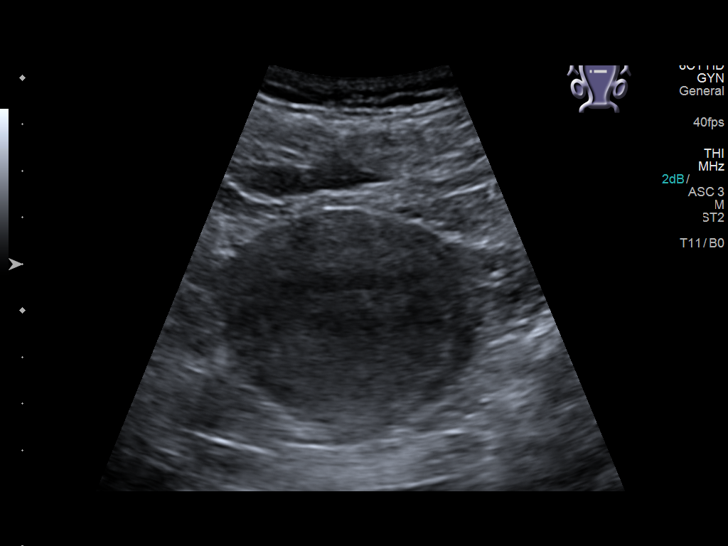
[im 19/114]
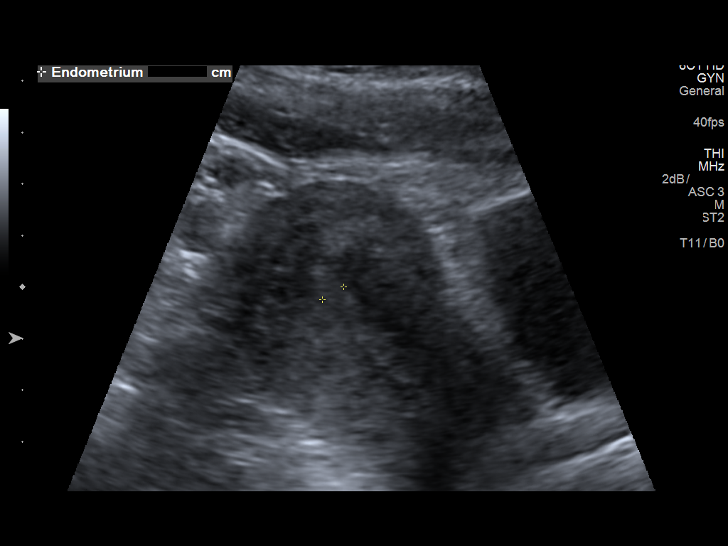
[im 29/114]
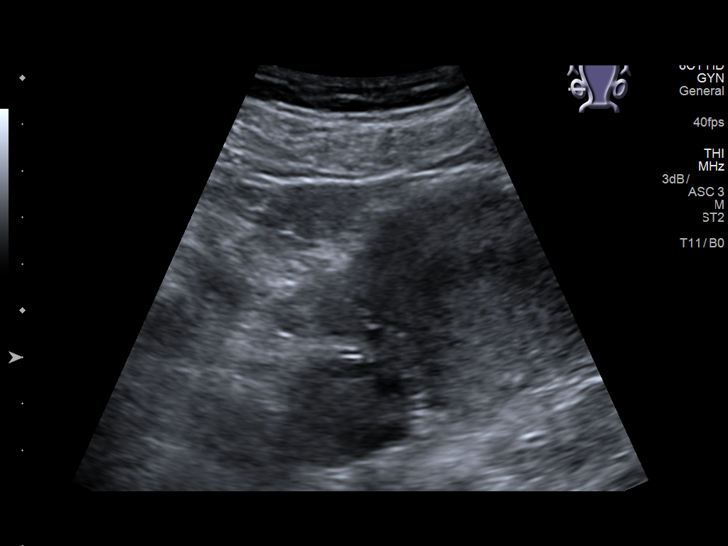
[im 38/114]
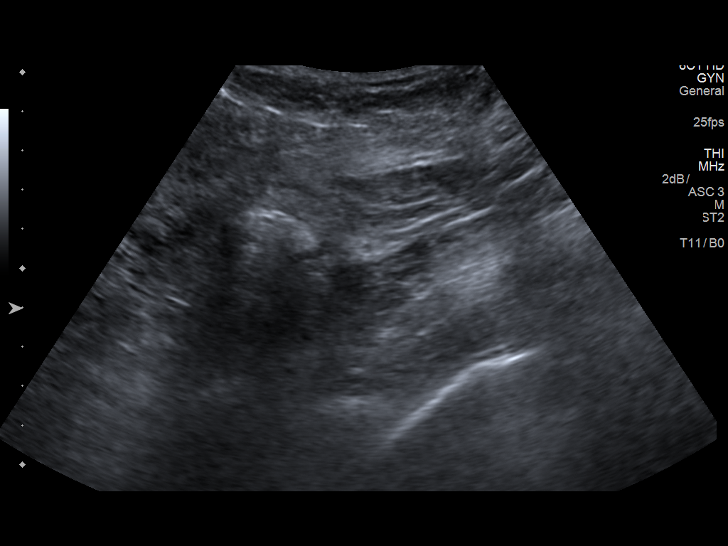
[im 43/114]
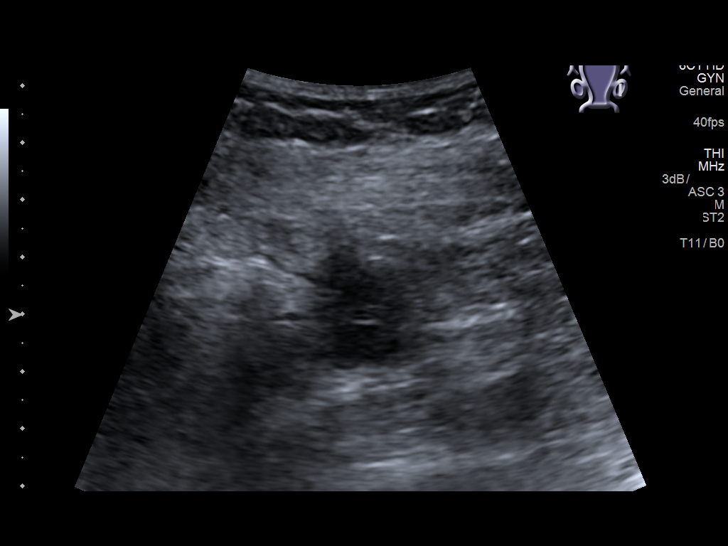
[im 52/114]
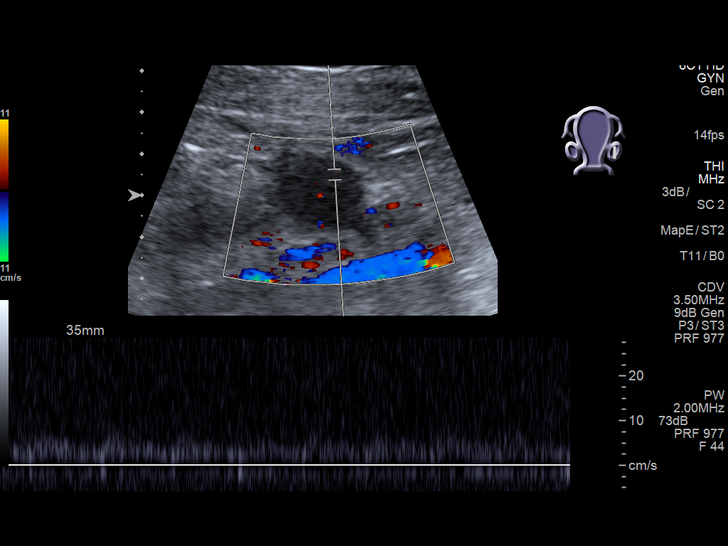
[im 62/114]
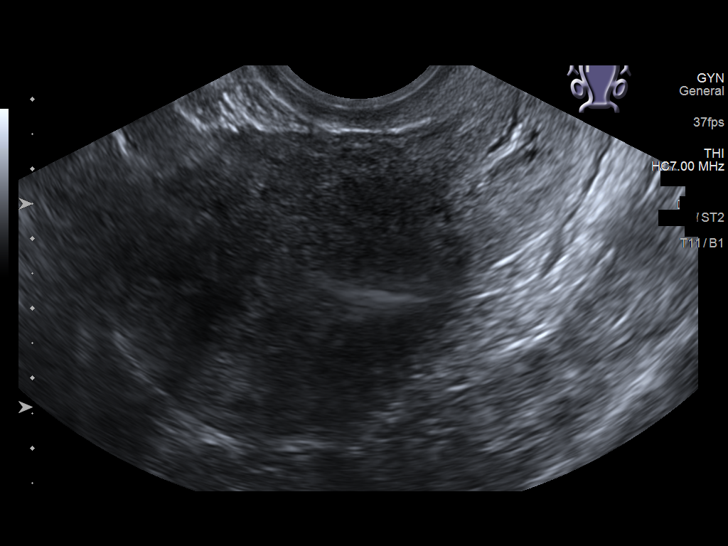
[im 71/114]
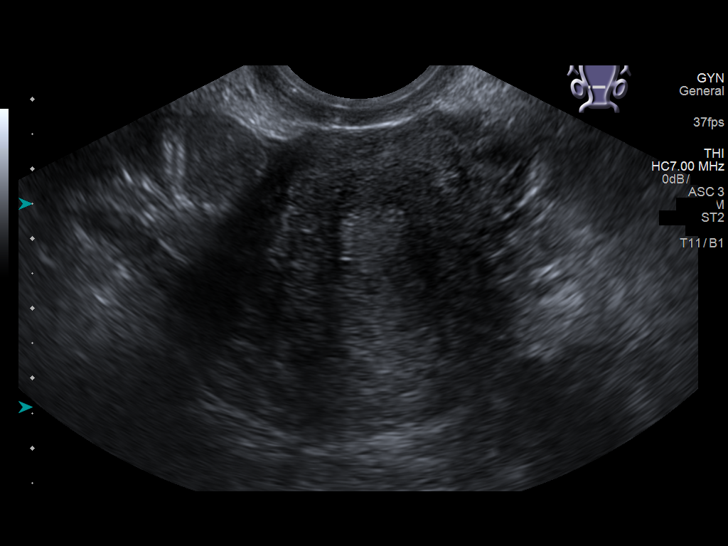
[im 76/114]
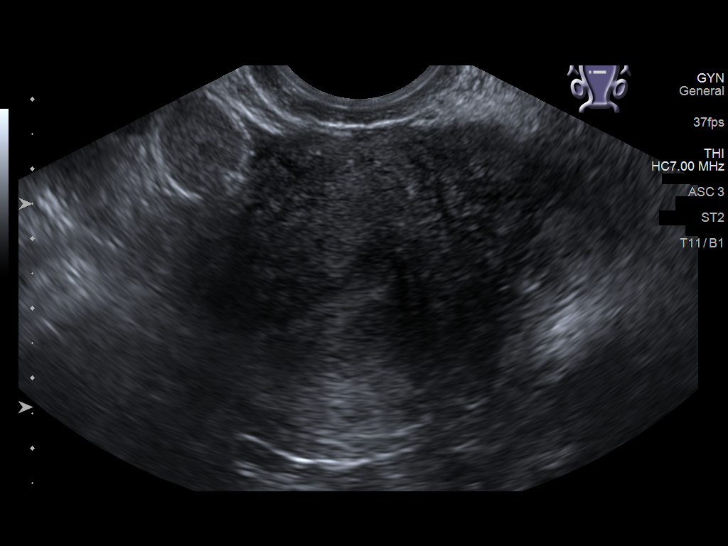
[im 85/114]
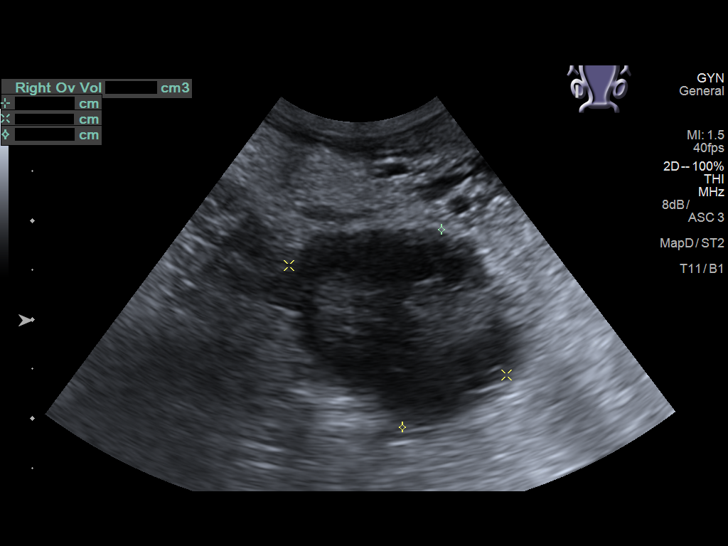
[im 95/114]
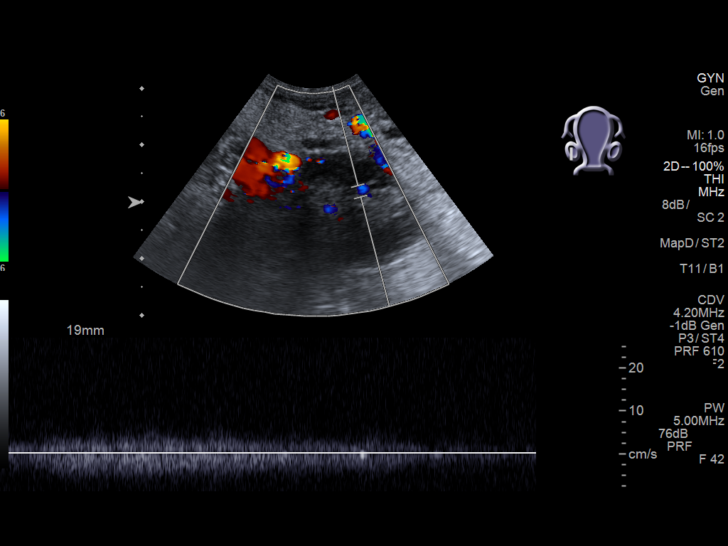
[im 104/114]
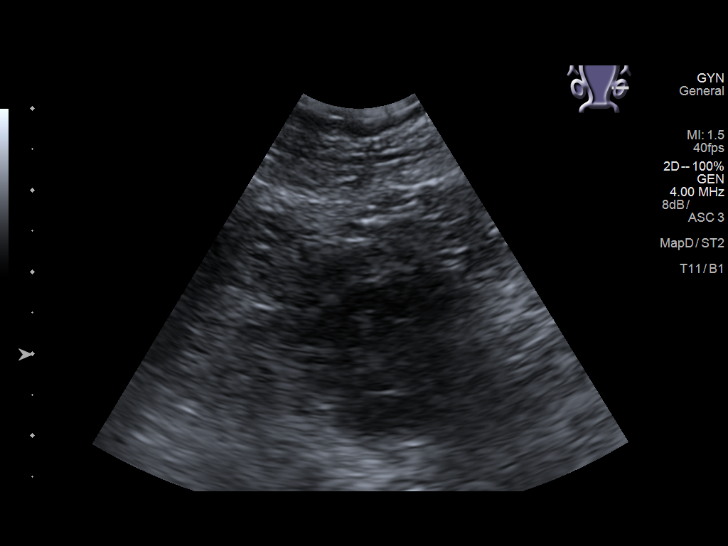
[im 114/114]
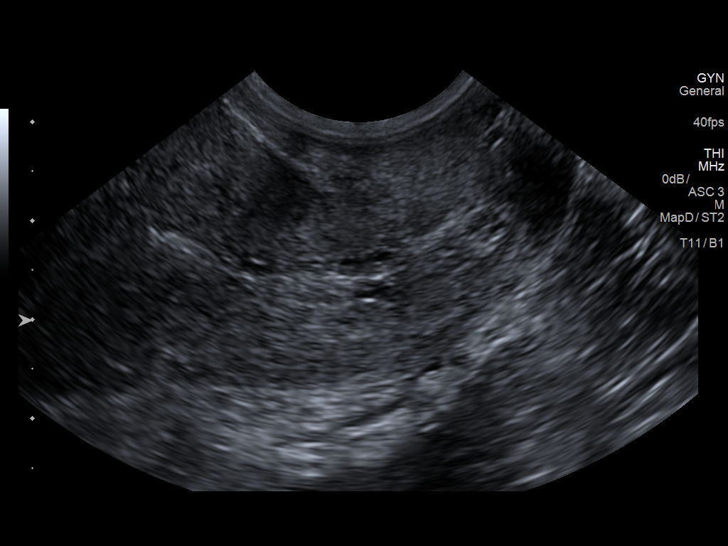

[14 of 25 positions shown; findings below may reference images not displayed]

FINDINGS: Uterus

Measurements: 8.8 x 4.4 x 5.4 cm. No fibroids or other mass
visualized.

Endometrium

Thickness: 6 mm.  No focal abnormality visualized.

Right ovary

Measurements: 2.5 x 2 x 2.2 cm. Normal appearance/no adnexal mass.

Left ovary

Measurements: 2.8 x 2 x 1.8 cm. Normal appearance/no adnexal mass.

Pulsed Doppler evaluation of both ovaries demonstrates normal
low-resistance arterial and venous waveforms.

Other findings

No abnormal free fluid.
IMPRESSION: Unremarkable pelvic ultrasound.

## 2019-01-07 DIAGNOSIS — S42009A Fracture of unspecified part of unspecified clavicle, initial encounter for closed fracture: Secondary | ICD-10-CM

## 2019-01-07 DIAGNOSIS — S92909A Unspecified fracture of unspecified foot, initial encounter for closed fracture: Secondary | ICD-10-CM

## 2019-01-07 HISTORY — DX: Unspecified fracture of unspecified foot, initial encounter for closed fracture: S92.909A

## 2019-01-07 HISTORY — DX: Fracture of unspecified part of unspecified clavicle, initial encounter for closed fracture: S42.009A

## 2019-02-02 ENCOUNTER — Emergency Department: Payer: Managed Care, Other (non HMO)

## 2019-02-02 ENCOUNTER — Other Ambulatory Visit: Payer: Self-pay

## 2019-02-02 ENCOUNTER — Emergency Department
Admission: EM | Admit: 2019-02-02 | Discharge: 2019-02-02 | Disposition: A | Discharge status: Home / Self Care | Payer: Managed Care, Other (non HMO) | Attending: Emergency Medicine | Admitting: Emergency Medicine

## 2019-02-02 DIAGNOSIS — S42032A Displaced fracture of lateral end of left clavicle, initial encounter for closed fracture: Secondary | ICD-10-CM | POA: Diagnosis not present

## 2019-02-02 DIAGNOSIS — M79672 Pain in left foot: Secondary | ICD-10-CM | POA: Diagnosis not present

## 2019-02-02 DIAGNOSIS — Y999 Unspecified external cause status: Secondary | ICD-10-CM | POA: Diagnosis not present

## 2019-02-02 DIAGNOSIS — S42035A Nondisplaced fracture of lateral end of left clavicle, initial encounter for closed fracture: Secondary | ICD-10-CM

## 2019-02-02 DIAGNOSIS — S92315A Nondisplaced fracture of first metatarsal bone, left foot, initial encounter for closed fracture: Secondary | ICD-10-CM | POA: Insufficient documentation

## 2019-02-02 DIAGNOSIS — Y929 Unspecified place or not applicable: Secondary | ICD-10-CM | POA: Insufficient documentation

## 2019-02-02 DIAGNOSIS — M25562 Pain in left knee: Secondary | ICD-10-CM | POA: Diagnosis not present

## 2019-02-02 DIAGNOSIS — Y939 Activity, unspecified: Secondary | ICD-10-CM | POA: Diagnosis not present

## 2019-02-02 DIAGNOSIS — S80811A Abrasion, right lower leg, initial encounter: Secondary | ICD-10-CM | POA: Diagnosis not present

## 2019-02-02 DIAGNOSIS — T148XXA Other injury of unspecified body region, initial encounter: Secondary | ICD-10-CM

## 2019-02-02 DIAGNOSIS — S4992XA Unspecified injury of left shoulder and upper arm, initial encounter: Secondary | ICD-10-CM | POA: Diagnosis present

## 2019-02-02 LAB — POCT PREGNANCY, URINE: Preg Test, Ur: NEGATIVE

## 2019-02-02 MED ORDER — IBUPROFEN 600 MG PO TABS: 600.0000 mg | ORAL_TABLET | Freq: Three times a day (TID) | ORAL | 0 refills | Status: DC | PRN

## 2019-02-02 MED ORDER — IBUPROFEN 600 MG PO TABS
600.0000 mg | ORAL_TABLET | Freq: Once | ORAL | Status: AC
  Administered 2019-02-02: 600 mg via ORAL
  Filled 2019-02-02: qty 1

## 2019-02-02 MED ORDER — BACITRACIN-NEOMYCIN-POLYMYXIN 400-5-5000 EX OINT
TOPICAL_OINTMENT | Freq: Once | CUTANEOUS | Status: AC
  Administered 2019-02-02: 1 via TOPICAL
  Filled 2019-02-02: qty 2

## 2019-02-02 MED ORDER — TRAMADOL HCL 50 MG PO TABS
50.0000 mg | ORAL_TABLET | Freq: Once | ORAL | Status: AC
  Administered 2019-02-02: 50 mg via ORAL
  Filled 2019-02-02: qty 1

## 2019-02-02 MED ORDER — TRAMADOL HCL 50 MG PO TABS: 50.0000 mg | ORAL_TABLET | Freq: Four times a day (QID) | ORAL | 0 refills | Status: DC | PRN

## 2019-02-02 MED ORDER — TRAMADOL HCL 50 MG PO TABS: 50.0000 mg | ORAL_TABLET | Freq: Four times a day (QID) | ORAL | 0 refills | Status: AC | PRN
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
## 2019-02-02 NOTE — Discharge Instructions (Signed)
Wear arm sling and postop shoe until evaluation by orthopedics.

## 2019-05-29 ENCOUNTER — Encounter: Payer: Self-pay | Admitting: Maternal Newborn

## 2019-05-29 ENCOUNTER — Ambulatory Visit (INDEPENDENT_AMBULATORY_CARE_PROVIDER_SITE_OTHER): Payer: Managed Care, Other (non HMO) | Admitting: Maternal Newborn

## 2019-05-29 ENCOUNTER — Other Ambulatory Visit: Payer: Self-pay

## 2019-05-29 ENCOUNTER — Other Ambulatory Visit (HOSPITAL_COMMUNITY)
Admission: RE | Admit: 2019-05-29 | Discharge: 2019-05-29 | Disposition: A | Discharge status: Home / Self Care | Payer: Managed Care, Other (non HMO) | Source: Ambulatory Visit | Attending: Maternal Newborn | Admitting: Maternal Newborn

## 2019-05-29 VITALS — BP 110/80 | HR 90 | Ht 62.0 in | Wt 136.0 lb

## 2019-05-29 DIAGNOSIS — N926 Irregular menstruation, unspecified: Secondary | ICD-10-CM

## 2019-05-29 DIAGNOSIS — Z124 Encounter for screening for malignant neoplasm of cervix: Secondary | ICD-10-CM | POA: Diagnosis not present

## 2019-05-29 DIAGNOSIS — Z130 Encounter for screening for diseases of the blood and blood-forming organs and certain disorders involving the immune mechanism: Secondary | ICD-10-CM

## 2019-05-29 DIAGNOSIS — Z3202 Encounter for pregnancy test, result negative: Secondary | ICD-10-CM | POA: Diagnosis not present

## 2019-05-29 DIAGNOSIS — Z3045 Encounter for surveillance of transdermal patch hormonal contraceptive device: Secondary | ICD-10-CM

## 2019-05-29 DIAGNOSIS — Z01419 Encounter for gynecological examination (general) (routine) without abnormal findings: Secondary | ICD-10-CM | POA: Diagnosis not present

## 2019-05-29 DIAGNOSIS — Z13220 Encounter for screening for lipoid disorders: Secondary | ICD-10-CM

## 2019-05-29 DIAGNOSIS — Z131 Encounter for screening for diabetes mellitus: Secondary | ICD-10-CM

## 2019-05-29 LAB — POCT URINE PREGNANCY: Preg Test, Ur: NEGATIVE
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
## 2019-05-30 LAB — CBC
Hematocrit: 38.2 % (ref 34.0–46.6)
Hemoglobin: 13.2 g/dL (ref 11.1–15.9)
MCH: 31.3 pg (ref 26.6–33.0)
MCHC: 34.6 g/dL (ref 31.5–35.7)
MCV: 91 fL (ref 79–97)
Platelets: 307 10*3/uL (ref 150–450)
RBC: 4.22 x10E6/uL (ref 3.77–5.28)
RDW: 12.8 % (ref 11.7–15.4)
WBC: 7.5 10*3/uL (ref 3.4–10.8)

## 2019-05-30 LAB — LIPID PANEL
Chol/HDL Ratio: 5.7 ratio — ABNORMAL HIGH (ref 0.0–4.4)
Cholesterol, Total: 226 mg/dL — ABNORMAL HIGH (ref 100–199)
HDL: 40 mg/dL (ref >39)
LDL Chol Calc (NIH): 133 mg/dL — ABNORMAL HIGH (ref 0–99)
Triglycerides: 296 mg/dL — ABNORMAL HIGH (ref 0–149)
VLDL Cholesterol Cal: 53 mg/dL — ABNORMAL HIGH (ref 5–40)

## 2019-05-30 LAB — COMPREHENSIVE METABOLIC PANEL
ALT: 10 IU/L (ref 0–32)
AST: 12 IU/L (ref 0–40)
Albumin/Globulin Ratio: 1.6 (ref 1.2–2.2)
Albumin: 4.3 g/dL (ref 3.8–4.8)
Alkaline Phosphatase: 100 IU/L (ref 39–117)
BUN/Creatinine Ratio: 12 (ref 9–23)
BUN: 11 mg/dL (ref 6–20)
Bilirubin Total: 0.3 mg/dL (ref 0.0–1.2)
CO2: 22 mmol/L (ref 20–29)
Calcium: 9.4 mg/dL (ref 8.7–10.2)
Chloride: 103 mmol/L (ref 96–106)
Creatinine, Ser: 0.89 mg/dL (ref 0.57–1.00)
GFR calc Af Amer: 100 mL/min/{1.73_m2} (ref >59)
GFR calc non Af Amer: 87 mL/min/{1.73_m2} (ref >59)
Globulin, Total: 2.7 g/dL (ref 1.5–4.5)
Glucose: 84 mg/dL (ref 65–99)
Potassium: 4.2 mmol/L (ref 3.5–5.2)
Sodium: 140 mmol/L (ref 134–144)
Total Protein: 7 g/dL (ref 6.0–8.5)

## 2019-05-31 ENCOUNTER — Encounter: Payer: Self-pay | Admitting: Maternal Newborn

## 2019-05-31 MED ORDER — XULANE 150-35 MCG/24HR TD PTWK: 1.0000 | MEDICATED_PATCH | TRANSDERMAL | 12 refills | Status: DC

## 2019-05-31 NOTE — Patient Instructions (Signed)
Preventive Care 21-31 Years Old, Female Preventive care refers to visits with your health care provider and lifestyle choices that can promote health and wellness. This includes:  A yearly physical exam. This may also be called an annual well check.  Regular dental visits and eye exams.  Immunizations.  Screening for certain conditions.  Healthy lifestyle choices, such as eating a healthy diet, getting regular exercise, not using drugs or products that contain nicotine and tobacco, and limiting alcohol use. What can I expect for my preventive care visit? Physical exam Your health care provider will check your:  Height and weight. This may be used to calculate body mass index (BMI), which tells if you are at a healthy weight.  Heart rate and blood pressure.  Skin for abnormal spots. Counseling Your health care provider may ask you questions about your:  Alcohol, tobacco, and drug use.  Emotional well-being.  Home and relationship well-being.  Sexual activity.  Eating habits.  Work and work environment.  Method of birth control.  Menstrual cycle.  Pregnancy history. What immunizations do I need?  Influenza (flu) vaccine  This is recommended every year. Tetanus, diphtheria, and pertussis (Tdap) vaccine  You may need a Td booster every 10 years. Varicella (chickenpox) vaccine  You may need this if you have not been vaccinated. Human papillomavirus (HPV) vaccine  If recommended by your health care provider, you may need three doses over 6 months. Measles, mumps, and rubella (MMR) vaccine  You may need at least one dose of MMR. You may also need a second dose. Meningococcal conjugate (MenACWY) vaccine  One dose is recommended if you are age 19-21 years and a first-year college student living in a residence hall, or if you have one of several medical conditions. You may also need additional booster doses. Pneumococcal conjugate (PCV13) vaccine  You may need  this if you have certain conditions and were not previously vaccinated. Pneumococcal polysaccharide (PPSV23) vaccine  You may need one or two doses if you smoke cigarettes or if you have certain conditions. Hepatitis A vaccine  You may need this if you have certain conditions or if you travel or work in places where you may be exposed to hepatitis A. Hepatitis B vaccine  You may need this if you have certain conditions or if you travel or work in places where you may be exposed to hepatitis B. Haemophilus influenzae type b (Hib) vaccine  You may need this if you have certain conditions. You may receive vaccines as individual doses or as more than one vaccine together in one shot (combination vaccines). Talk with your health care provider about the risks and benefits of combination vaccines. What tests do I need?  Blood tests  Lipid and cholesterol levels. These may be checked every 5 years starting at age 20.  Hepatitis C test.  Hepatitis B test. Screening  Diabetes screening. This is done by checking your blood sugar (glucose) after you have not eaten for a while (fasting).  Sexually transmitted disease (STD) testing.  BRCA-related cancer screening. This may be done if you have a family history of breast, ovarian, tubal, or peritoneal cancers.  Pelvic exam and Pap test. This may be done every 3 years starting at age 21. Starting at age 30, this may be done every 5 years if you have a Pap test in combination with an HPV test. Talk with your health care provider about your test results, treatment options, and if necessary, the need for more tests.   Follow these instructions at home: Eating and drinking   Eat a diet that includes fresh fruits and vegetables, whole grains, lean protein, and low-fat dairy.  Take vitamin and mineral supplements as recommended by your health care provider.  Do not drink alcohol if: ? Your health care provider tells you not to drink. ? You are  pregnant, may be pregnant, or are planning to become pregnant.  If you drink alcohol: ? Limit how much you have to 0-1 drink a day. ? Be aware of how much alcohol is in your drink. In the [place] one drink equals one 12 oz bottle of beer (355 mL), one 5 oz glass of wine (148 mL), or one 1 oz glass of hard liquor (44 mL). Lifestyle  Take daily care of your teeth and gums.  Stay active. Exercise for at least 30 minutes on 5 or more days each week.  Do not use any products that contain nicotine or tobacco, such as cigarettes, e-cigarettes, and chewing tobacco. If you need help quitting, ask your health care provider.  If you are sexually active, practice safe sex. Use a condom or other form of birth control (contraception) in order to prevent pregnancy and STIs (sexually transmitted infections). If you plan to become pregnant, see your health care provider for a preconception visit. What's next?  Visit your health care provider once a year for a well check visit.  Ask your health care provider how often you should have your eyes and teeth checked.  Stay up to date on all vaccines. This information is not intended to replace advice given to you by your health care provider. Make sure you discuss any questions you have with your health care provider. Document Released: 09/20/2001 Document Revised: 04/05/2018 Document Reviewed: 04/05/2018 Elsevier Patient Education  2020 Elsevier Inc.  

## 2019-06-03 LAB — CYTOLOGY - PAP
Comment: NEGATIVE
Diagnosis: NEGATIVE
High risk HPV: NEGATIVE

## 2019-06-18 ENCOUNTER — Ambulatory Visit: Payer: Managed Care, Other (non HMO) | Admitting: Certified Nurse Midwife

## 2019-08-16 ENCOUNTER — Ambulatory Visit: Payer: Managed Care, Other (non HMO) | Attending: Internal Medicine

## 2019-08-16 DIAGNOSIS — Z20822 Contact with and (suspected) exposure to covid-19: Secondary | ICD-10-CM

## 2019-08-18 LAB — NOVEL CORONAVIRUS, NAA: SARS-CoV-2, NAA: NOT DETECTED

## 2019-09-09 ENCOUNTER — Ambulatory Visit: Payer: Managed Care, Other (non HMO) | Attending: Internal Medicine

## 2019-09-09 DIAGNOSIS — Z20822 Contact with and (suspected) exposure to covid-19: Secondary | ICD-10-CM

## 2019-09-10 LAB — NOVEL CORONAVIRUS, NAA: SARS-CoV-2, NAA: NOT DETECTED
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
## 2020-04-10 ENCOUNTER — Other Ambulatory Visit: Payer: Self-pay

## 2020-04-10 ENCOUNTER — Ambulatory Visit: Payer: Managed Care, Other (non HMO) | Admitting: Physician Assistant

## 2020-04-10 VITALS — BP 122/81 | HR 80 | Temp 98.2°F | Resp 15 | Ht 62.0 in | Wt 122.0 lb

## 2020-04-10 DIAGNOSIS — Z008 Encounter for other general examination: Secondary | ICD-10-CM

## 2020-04-10 DIAGNOSIS — Z0000 Encounter for general adult medical examination without abnormal findings: Secondary | ICD-10-CM | POA: Diagnosis not present

## 2020-04-10 NOTE — Patient Instructions (Signed)
Discussed the importance of ME time and time for de stressors. Discussed the need for consistent exercise and healthy eating. 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 Return to clinic in one year.

## 2020-04-10 NOTE — Addendum Note (Signed)
Addended by: Annitta Needs B on: 04/10/2020 12:19 PM   Modules accepted: Orders

## 2020-04-10 NOTE — Addendum Note (Signed)
Addended by: Annitta Needs B on: 04/10/2020 12:15 PM   Modules accepted: Orders
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
## 2020-04-11 LAB — GLUCOSE, RANDOM: Glucose: 91 mg/dL (ref 65–99)

## 2020-04-11 LAB — LIPID PANEL
Chol/HDL Ratio: 4.1 ratio (ref 0.0–4.4)
Cholesterol, Total: 231 mg/dL — ABNORMAL HIGH (ref 100–199)
HDL: 56 mg/dL (ref >39)
LDL Chol Calc (NIH): 132 mg/dL — ABNORMAL HIGH (ref 0–99)
Triglycerides: 240 mg/dL — ABNORMAL HIGH (ref 0–149)
VLDL Cholesterol Cal: 43 mg/dL — ABNORMAL HIGH (ref 5–40)

## 2020-04-22 ENCOUNTER — Telehealth: Payer: Self-pay | Admitting: Obstetrics and Gynecology

## 2020-04-22 DIAGNOSIS — Z3045 Encounter for surveillance of transdermal patch hormonal contraceptive device: Secondary | ICD-10-CM

## 2020-04-22 MED ORDER — XULANE 150-35 MCG/24HR TD PTWK: 1.0000 | MEDICATED_PATCH | TRANSDERMAL | 0 refills | Status: DC

## 2020-04-22 NOTE — Telephone Encounter (Signed)
RF sent.

## 2020-04-22 NOTE — Telephone Encounter (Signed)
Patient was scheduled for 06/02/20 for annual. There was a scheduled change and patient had to be rescheduled to 06/04/20. Patient needs refill on her birthcontrol patches to get to her schedule appointment. [facility] on [address] Please advise

## 2020-04-22 NOTE — Addendum Note (Signed)
Addended by: Donnetta Hail on: 04/22/2020 04:26 PM   Modules accepted: Orders

## 2020-06-02 ENCOUNTER — Ambulatory Visit: Payer: Managed Care, Other (non HMO) | Admitting: Obstetrics and Gynecology
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
## 2020-06-04 ENCOUNTER — Ambulatory Visit: Payer: Managed Care, Other (non HMO) | Admitting: Obstetrics and Gynecology

## 2020-06-04 DIAGNOSIS — Z01419 Encounter for gynecological examination (general) (routine) without abnormal findings: Secondary | ICD-10-CM

## 2020-06-15 ENCOUNTER — Other Ambulatory Visit: Payer: Self-pay | Admitting: Obstetrics and Gynecology

## 2020-06-15 DIAGNOSIS — Z3045 Encounter for surveillance of transdermal patch hormonal contraceptive device: Secondary | ICD-10-CM

## 2020-06-15 MED ORDER — XULANE 150-35 MCG/24HR TD PTWK: 1.0000 | MEDICATED_PATCH | TRANSDERMAL | 0 refills | Status: DC

## 2020-07-20 ENCOUNTER — Ambulatory Visit: Payer: Managed Care, Other (non HMO) | Admitting: Obstetrics and Gynecology

## 2020-07-21 ENCOUNTER — Ambulatory Visit (INDEPENDENT_AMBULATORY_CARE_PROVIDER_SITE_OTHER): Payer: Managed Care, Other (non HMO) | Admitting: Obstetrics and Gynecology

## 2020-07-21 ENCOUNTER — Encounter: Payer: Self-pay | Admitting: Obstetrics and Gynecology

## 2020-07-21 ENCOUNTER — Other Ambulatory Visit: Payer: Self-pay

## 2020-07-21 VITALS — BP 100/60 | Ht 62.0 in | Wt 118.0 lb

## 2020-07-21 DIAGNOSIS — Z01419 Encounter for gynecological examination (general) (routine) without abnormal findings: Secondary | ICD-10-CM

## 2020-07-21 DIAGNOSIS — Z3045 Encounter for surveillance of transdermal patch hormonal contraceptive device: Secondary | ICD-10-CM | POA: Diagnosis not present

## 2020-07-21 MED ORDER — XULANE 150-35 MCG/24HR TD PTWK: 1.0000 | MEDICATED_PATCH | TRANSDERMAL | 3 refills | Status: DC
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
## 2020-07-21 NOTE — Patient Instructions (Signed)
I value your feedback and entrusting us with your care. If you get a [facility] patient survey, I would appreciate you taking the time to let us know about your experience today. Thank you!  As of July 18, 2019, your lab results will be released to your MyChart immediately, before I even have a chance to see them. Please give me time to review them and contact you if there are any abnormalities. Thank you for your patience.  

## 2020-12-01 ENCOUNTER — Other Ambulatory Visit: Payer: Self-pay | Admitting: Obstetrics and Gynecology

## 2020-12-01 ENCOUNTER — Telehealth: Payer: Self-pay

## 2020-12-01 DIAGNOSIS — Z3045 Encounter for surveillance of transdermal patch hormonal contraceptive device: Secondary | ICD-10-CM

## 2020-12-01 NOTE — Telephone Encounter (Signed)
Pt calling to see if she can get more than a one month supply of bc at a time.  Isn't due for annual for a while.  [phone]

## 2020-12-01 NOTE — Telephone Encounter (Signed)
I sent in 3 months at a time, so it may be due to her insurance. Pt to f/u with pharmacy re: why only getting one a month.

## 2020-12-02 ENCOUNTER — Other Ambulatory Visit: Payer: Self-pay | Admitting: Obstetrics and Gynecology

## 2020-12-02 DIAGNOSIS — Z3045 Encounter for surveillance of transdermal patch hormonal contraceptive device: Secondary | ICD-10-CM

## 2020-12-02 MED ORDER — XULANE 150-35 MCG/24HR TD PTWK: 1.0000 | MEDICATED_PATCH | TRANSDERMAL | 2 refills | Status: DC

## 2020-12-02 NOTE — Telephone Encounter (Signed)
Pt aware.

## 2020-12-02 NOTE — Telephone Encounter (Signed)
Oops. Yep, good catch. Rx RF erxd. Thx!!! :-)

## 2020-12-02 NOTE — Progress Notes (Signed)
Rx RF xulane till 12/22 annual

## 2021-09-02 ENCOUNTER — Other Ambulatory Visit: Payer: Self-pay | Admitting: Obstetrics and Gynecology

## 2021-09-02 DIAGNOSIS — Z3045 Encounter for surveillance of transdermal patch hormonal contraceptive device: Secondary | ICD-10-CM

## 2021-09-27 ENCOUNTER — Telehealth: Payer: Self-pay

## 2021-09-27 ENCOUNTER — Other Ambulatory Visit: Payer: Self-pay | Admitting: Obstetrics and Gynecology

## 2021-09-27 DIAGNOSIS — Z3045 Encounter for surveillance of transdermal patch hormonal contraceptive device: Secondary | ICD-10-CM

## 2021-09-27 MED ORDER — XULANE 150-35 MCG/24HR TD PTWK: 1.0000 | MEDICATED_PATCH | TRANSDERMAL | 0 refills | Status: DC

## 2021-09-27 NOTE — Telephone Encounter (Signed)
Pt calling for refill on [facility] Patch. Can I refill 1 month? Her annual is scheduled but she's been 1 month with out.

## 2021-09-27 NOTE — Telephone Encounter (Signed)
Pt aware.

## 2021-09-27 NOTE — Progress Notes (Signed)
Rx RF xulane for 1 mo till 3/23 annual.

## 2021-09-27 NOTE — Telephone Encounter (Signed)
Rx eRxd.  

## 2021-10-21 ENCOUNTER — Encounter: Payer: Self-pay | Admitting: Obstetrics and Gynecology

## 2021-10-21 ENCOUNTER — Other Ambulatory Visit: Payer: Self-pay

## 2021-10-21 ENCOUNTER — Ambulatory Visit (INDEPENDENT_AMBULATORY_CARE_PROVIDER_SITE_OTHER): Payer: Managed Care, Other (non HMO) | Admitting: Obstetrics and Gynecology

## 2021-10-21 VITALS — BP 110/70 | Ht 62.0 in | Wt 129.0 lb

## 2021-10-21 DIAGNOSIS — Z01419 Encounter for gynecological examination (general) (routine) without abnormal findings: Secondary | ICD-10-CM | POA: Diagnosis not present

## 2021-10-21 DIAGNOSIS — Z3045 Encounter for surveillance of transdermal patch hormonal contraceptive device: Secondary | ICD-10-CM | POA: Diagnosis not present

## 2021-10-21 MED ORDER — XULANE 150-35 MCG/24HR TD PTWK: 1.0000 | MEDICATED_PATCH | TRANSDERMAL | 3 refills | Status: DC

## 2021-10-21 NOTE — Progress Notes (Signed)
? ?PCP:  Fayrene Helper, NP ? ? ?Chief Complaint  ?Patient presents with  ? Gynecologic Exam  ?  No concerns  ? ? ? ?HPI: ?     [name] is a 36 y.o. G1P1001 whose LMP was Patient's last menstrual period was 09/27/2021 (approximate)., presents today for her annual examination.  Her menses are regular every 3-4 wks with xulane, lasting 4 days. Sometimes period starts on 3rd wk of patches and continues to have spotting during placebo wk. Using patches correctly. Dysmenorrhea mild, improved with NSAIDs/heating pad.  ? ?Sex activity: single partner, contraception -  Xulane. Had nausea with OCPs, decreased libido with nuvaring, not interested in nexplanon.  ? ?Last Pap: 05/29/19  Results were: no abnormalities /neg HPV DNA. No hx of abn paps with bx/tx. ? ?There is a FH of breast cancer in her MGGM, genetic testing not indicated. There is no FH of ovarian cancer (mother had cx dysplasia not ovar ca). The patient does do self-breast exams. ? ?Tobacco use: The patient denies current or previous tobacco use. ?Alcohol use: social drinker ?No drug use.  ?Exercise: moderately active ? ?She does get adequate calcium but not Vitamin D in her diet. ? ? ?Past Medical History:  ?Diagnosis Date  ? Broken clavicle 01/2019  ? left  ? Broken foot 01/2019  ? left  ? Mitral valve prolapse   ? Preeclampsia   ? w/o severe features  ? Preeclampsia in postpartum period, third trimester 12/01/2015  ? ? ?Past Surgical History:  ?Procedure Laterality Date  ? WISDOM TOOTH EXTRACTION    ? ? ?Family History  ?Problem Relation Age of Onset  ? Alcohol abuse Mother   ? Cirrhosis Mother   ? Hypertension Mother   ? Other Mother 20  ?     cx dysplasia  ? Heart disease Maternal Grandmother   ? Hypertension Maternal Grandmother   ? Lung cancer Maternal Grandfather 60  ?     mets to bone  ? Breast cancer Other   ? ? ?Social History  ? ?Socioeconomic History  ? Marital status: Married  ?  Spouse name: Not on file  ? Number of children: 1  ?  Years of education: 59  ? Highest education level: Not on file  ?Occupational History  ? Not on file  ?Tobacco Use  ? Smoking status: Never  ? Smokeless tobacco: Never  ?Vaping Use  ? Vaping Use: Never used  ?Substance and Sexual Activity  ? Alcohol use: Yes  ?  Comment: social  ? Drug use: No  ? Sexual activity: Yes  ?  Partners: Male  ?  Birth control/protection: Patch  ?Other Topics Concern  ? Not on file  ?Social History Narrative  ? Not on file  ? ?Social Determinants of Health  ? ?Financial Resource Strain: Not on file  ?Food Insecurity: Not on file  ?Transportation Needs: Not on file  ?Physical Activity: Not on file  ?Stress: Not on file  ?Social Connections: Not on file  ?Intimate Partner Violence: Not on file  ? ? ? ?Current Outpatient Medications:  ?  norelgestromin-ethinyl estradiol [employer]) 150-35 MCG/24HR transdermal patch, Place 1 patch onto the skin once a week., Disp: 3 patch, Rfl: 3 ? ? ? ? ?ROS: ? ?Review of Systems  ?Constitutional:  Negative for fatigue, fever and unexpected weight change.  ?Respiratory:  Negative for cough, shortness of breath and wheezing.   ?Cardiovascular:  Negative for chest pain, palpitations and leg swelling.  ?  Gastrointestinal:  Negative for blood in stool, constipation, diarrhea, nausea and vomiting.  ?Endocrine: Negative for cold intolerance, heat intolerance and polyuria.  ?Genitourinary:  Negative for dyspareunia, dysuria, flank pain, frequency, genital sores, hematuria, menstrual problem, pelvic pain, urgency, vaginal bleeding, vaginal discharge and vaginal pain.  ?Musculoskeletal:  Negative for back pain, joint swelling and myalgias.  ?Skin:  Negative for rash.  ?Neurological:  Negative for dizziness, syncope, light-headedness, numbness and headaches.  ?Hematological:  Negative for adenopathy.  ?Psychiatric/Behavioral:  Negative for agitation, confusion, sleep disturbance and suicidal ideas. The patient is not nervous/anxious.   ?BREAST: No  symptoms ? ? ?Objective: ?BP 110/70   Ht 5\' 2"  (1.575 m)   Wt 129 lb (58.5 kg)   LMP 09/27/2021 (Approximate)   BMI 23.59 kg/m?  ? ? ?Physical Exam ?Constitutional:   ?   Appearance: She is well-developed.  ?Genitourinary:  ?   Vulva normal.  ?   Right Labia: No rash, tenderness or lesions. ?   Left Labia: No tenderness, lesions or rash. ?   No vaginal discharge, erythema or tenderness.  ? ?   Right Adnexa: not tender and no mass present. ?   Left Adnexa: not tender and no mass present. ?   No cervical friability or polyp.  ?   Uterus is not enlarged or tender.  ?Breasts: ?   Right: No mass, nipple discharge, skin change or tenderness.  ?   Left: No mass, nipple discharge, skin change or tenderness.  ?Neck:  ?   Thyroid: No thyromegaly.  ?Cardiovascular:  ?   Rate and Rhythm: Normal rate and regular rhythm.  ?   Heart sounds: Normal heart sounds. No murmur heard. ?Pulmonary:  ?   Effort: Pulmonary effort is normal.  ?   Breath sounds: Normal breath sounds.  ?Abdominal:  ?   Palpations: Abdomen is soft.  ?   Tenderness: There is no abdominal tenderness. There is no guarding or rebound.  ?Musculoskeletal:     ?   General: Normal range of motion.  ?   Cervical back: Normal range of motion.  ?Lymphadenopathy:  ?   Cervical: No cervical adenopathy.  ?Neurological:  ?   General: No focal deficit present.  ?   Mental Status: She is alert and oriented to person, place, and time.  ?   Cranial Nerves: No cranial nerve deficit.  ?Skin: ?   General: Skin is warm and dry.  ?Psychiatric:     ?   Mood and Affect: Mood normal.     ?   Behavior: Behavior normal.     ?   Thought Content: Thought content normal.     ?   Judgment: Judgment normal.  ?Vitals reviewed.  ? ? ?Assessment/Plan: ?Encounter for annual routine gynecological examination ? ?Encounter for surveillance of transdermal patch hormonal contraceptive device - Plan: norelgestromin-ethinyl estradiol [employer]ico(XULANE) 150-35 MCG/24HR transdermal patch; Rx RF xulane. Has BTB  with patch but declines other BC method. ? ? ?Meds ordered this encounter  ?Medications  ? norelgestromin-ethinyl estradiol [employer]ico(XULANE) 150-35 MCG/24HR transdermal patch  ?  Sig: Place 1 patch onto the skin once a week.  ?  Dispense:  3 patch  ?  Refill:  3  ?  Order Specific Question:   Supervising Provider  ?  AnswerNadara Mustard:   HARRIS, ROBERT P [[id number]]  ? ?          ?GYN counsel adequate intake of calcium and vitamin D, diet and exercise ? ? ?  F/U ?  Return in about 1 year (around 10/22/2022). ? ?[place] Noori B. Dekendrick Uzelac, PA-C ?10/21/2021 ?1:28 PM ?

## 2021-10-21 NOTE — Patient Instructions (Signed)
I value your feedback and you entrusting us with your care. If you get a [facility] patient survey, I would appreciate you taking the time to let us know about your experience today. Thank you! ? ? ?

## 2023-06-12 ENCOUNTER — Other Ambulatory Visit: Payer: Self-pay

## 2023-06-12 ENCOUNTER — Emergency Department
Admission: EM | Admit: 2023-06-12 | Discharge: 2023-06-12 | Disposition: A | Discharge status: Home / Self Care | Payer: Managed Care, Other (non HMO) | Attending: Emergency Medicine | Admitting: Emergency Medicine

## 2023-06-12 DIAGNOSIS — R1030 Lower abdominal pain, unspecified: Secondary | ICD-10-CM

## 2023-06-12 DIAGNOSIS — N390 Urinary tract infection, site not specified: Secondary | ICD-10-CM | POA: Insufficient documentation

## 2023-06-12 LAB — COMPREHENSIVE METABOLIC PANEL
ALT: 14 U/L (ref 0–44)
AST: 15 U/L (ref 15–41)
Albumin: 4.2 g/dL (ref 3.5–5.0)
Alkaline Phosphatase: 73 U/L (ref 38–126)
Anion gap: 8 (ref 5–15)
BUN: 18 mg/dL (ref 6–20)
CO2: 24 mmol/L (ref 22–32)
Calcium: 8.8 mg/dL — ABNORMAL LOW (ref 8.9–10.3)
Chloride: 103 mmol/L (ref 98–111)
Creatinine, Ser: 0.93 mg/dL (ref 0.44–1.00)
GFR, Estimated: >60 mL/min (ref >60)
Glucose, Bld: 87 mg/dL (ref 70–99)
Potassium: 3.8 mmol/L (ref 3.5–5.1)
Sodium: 135 mmol/L (ref 135–145)
Total Bilirubin: 0.4 mg/dL (ref <1.2)
Total Protein: 7.4 g/dL (ref 6.5–8.1)

## 2023-06-12 LAB — URINALYSIS, ROUTINE W REFLEX MICROSCOPIC
Bilirubin Urine: NEGATIVE
Glucose, UA: NEGATIVE mg/dL
Ketones, ur: NEGATIVE mg/dL
Nitrite: NEGATIVE
Protein, ur: NEGATIVE mg/dL
Specific Gravity, Urine: 1.018 (ref 1.005–1.030)
pH: 8 (ref 5.0–8.0)

## 2023-06-12 LAB — POC URINE PREG, ED: Preg Test, Ur: NEGATIVE

## 2023-06-12 LAB — CBC
HCT: 39 % (ref 36.0–46.0)
Hemoglobin: 13.6 g/dL (ref 12.0–15.0)
MCH: 31.3 pg (ref 26.0–34.0)
MCHC: 34.9 g/dL (ref 30.0–36.0)
MCV: 89.9 fL (ref 80.0–100.0)
Platelets: 313 10*3/uL (ref 150–400)
RBC: 4.34 MIL/uL (ref 3.87–5.11)
RDW: 12.9 % (ref 11.5–15.5)
WBC: 7.7 10*3/uL (ref 4.0–10.5)
nRBC: 0 % (ref 0.0–0.2)

## 2023-06-12 LAB — LIPASE, BLOOD: Lipase: 32 U/L (ref 11–51)

## 2023-06-12 MED ORDER — CEFDINIR 300 MG PO CAPS
300.0000 mg | ORAL_CAPSULE | Freq: Two times a day (BID) | ORAL | 0 refills | Status: AC
Start: 2023-06-12 — End: 2023-06-15

## 2023-06-12 MED ORDER — CEFDINIR 300 MG PO CAPS: 300.0000 mg | ORAL_CAPSULE | Freq: Two times a day (BID) | ORAL | 0 refills | Status: DC

## 2023-06-12 NOTE — ED Provider Notes (Signed)
[facility] Provider Note   Event Date/Time   First MD Initiated Contact with Patient 06/12/23 1638     (approximate) History  Abdominal Pain  HPI Rachael Morris is a 37 y.o. female with a past medical history of mitral valve prolapse who presents for lower pelvic pain that began last night's and describes it as sharp/stabbing.  Patient does endorse pain with urination and endorses nausea without vomiting.  Patient also endorses associated headache ROS: Patient currently denies any vision changes, tinnitus, difficulty speaking, facial droop, sore throat, chest pain, shortness of breath, vomiting/diarrhea, or weakness/numbness/paresthesias in any extremity   Physical Exam  Triage Vital Signs: ED Triage Vitals  Encounter Vitals Group     BP 06/12/23 1333 (!) 116/101     Systolic BP Percentile --      Diastolic BP Percentile --      Pulse Rate 06/12/23 1333 92     Resp 06/12/23 1333 18     Temp 06/12/23 1333 98 F (36.7 C)     Temp src --      SpO2 06/12/23 1333 98 %     Weight --      Height --      Head Circumference --      Peak Flow --      Pain Score 06/12/23 1332 3     Pain Loc --      Pain Education --      Exclude from Growth Chart --    Most recent vital signs: Vitals:   06/12/23 1333 06/12/23 1647  BP: (!) 116/101 (!) 126/97  Pulse: 92 79  Resp: 18 18  Temp: 98 F (36.7 C)   SpO2: 98% 100%   General: Awake, oriented x4. CV:  Good peripheral perfusion.  Resp:  Normal effort.  Abd:  No distention.  Other:  Middle-aged overweight Caucasian female resting comfortably in no acute distress ED Results / Procedures / Treatments  Labs (all labs ordered are listed, but only abnormal results are displayed) Labs Reviewed  COMPREHENSIVE METABOLIC PANEL - Abnormal; Notable for the following components:      Result Value   Calcium 8.8 (*)    All other components within normal limits  URINALYSIS, ROUTINE W REFLEX MICROSCOPIC - Abnormal;  Notable for the following components:   Color, Urine YELLOW (*)    APPearance HAZY (*)    Hgb urine dipstick SMALL (*)    Leukocytes,Ua SMALL (*)    Bacteria, UA RARE (*)    All other components within normal limits  LIPASE, BLOOD  CBC  POC URINE PREG, ED  PROCEDURES: Critical Care performed: No Procedures MEDICATIONS ORDERED IN ED: Medications - No data to display IMPRESSION / MDM / ASSESSMENT AND PLAN / ED COURSE  I reviewed the triage vital signs and the nursing notes.                             The patient is on the cardiac monitor to evaluate for evidence of arrhythmia and/or significant heart rate changes. Patient's presentation is most consistent with acute presentation with potential threat to life or bodily function. Not Pregnant. Unlikely TOA, Ovarian Torsion, PID, gonorrhea/chlamydia. Low suspicion for Infected Urolithiasis, AAA, Cholecystitis, Pancreatitis, SBO, Appendicitis, or other acute abdomen.  Rx: Cefdinir 300 mg BID for 3 days Disposition: Discharge home. SRP discussed. Advise follow up with primary care provider within 24-72 hours.   FINAL CLINICAL  IMPRESSION(S) / ED DIAGNOSES   Final diagnoses:  Lower urinary tract infectious disease  Lower abdominal pain   Rx / DC Orders   ED Discharge Orders          Ordered    cefdinir (OMNICEF) 300 MG capsule  2 times daily,   Status:  Discontinued        06/12/23 1754    cefdinir (OMNICEF) 300 MG capsule  2 times daily        06/12/23 1755           Note:  This document was prepared using Dragon voice recognition software and may include unintentional dictation errors.   Merwyn Katos, MD 06/12/23 [phone]

## 2023-06-12 NOTE — ED Triage Notes (Signed)
Pt comes with c/o lower pelvic pain that started last night. Pt stats it is sharp and stabbing. Pt stats some pain after urination. Pt nauseated but no vomiting. Pt stats dull headache.

## 2023-10-20 ENCOUNTER — Ambulatory Visit: Admitting: Certified Nurse Midwife

## 2023-10-20 ENCOUNTER — Other Ambulatory Visit (HOSPITAL_COMMUNITY)
Admission: RE | Admit: 2023-10-20 | Discharge: 2023-10-20 | Disposition: A | Discharge status: Home / Self Care | Source: Ambulatory Visit | Attending: Certified Nurse Midwife | Admitting: Certified Nurse Midwife

## 2023-10-20 ENCOUNTER — Encounter: Payer: Self-pay | Admitting: Certified Nurse Midwife

## 2023-10-20 VITALS — BP 127/83 | HR 99 | Ht 62.0 in | Wt 137.0 lb

## 2023-10-20 DIAGNOSIS — Z113 Encounter for screening for infections with a predominantly sexual mode of transmission: Secondary | ICD-10-CM

## 2023-10-20 DIAGNOSIS — Z1151 Encounter for screening for human papillomavirus (HPV): Secondary | ICD-10-CM

## 2023-10-20 DIAGNOSIS — Z124 Encounter for screening for malignant neoplasm of cervix: Secondary | ICD-10-CM | POA: Diagnosis present

## 2023-10-20 DIAGNOSIS — N926 Irregular menstruation, unspecified: Secondary | ICD-10-CM

## 2023-10-20 DIAGNOSIS — N852 Hypertrophy of uterus: Secondary | ICD-10-CM | POA: Diagnosis not present

## 2023-10-20 NOTE — Progress Notes (Signed)
 Orson Eva, NP   Chief Complaint  Patient presents with   Menorrhagia    Bleeding and some pain with sex. Been bleeding for the past 6 months on and off.     HPI:      Rachael Morris is a 38 y.o. G1P1001 whose LMP was Patient's last menstrual period was 10/18/2023., presents today for heavy irregular bleeding. She has had bleeding after IC as well as irregular vaginal bleeding that is at times heavier than a period for approximately the last 20m. She stopped hormonal birth control (patch) about a year ago, even on patch her cycles were irregular at times. Also reports painful IC occasionally. Monogamous with single female partner, husband. Not planning pregnancy at this time.  After IC over the weekend she experienced bright red bleeding, it was moderate at first but tapered quickly. She then had spotting from Sunday to Wednesday. Wednesday she had increased bleeding, then woke early Thursday with painful cramping & filled & overflowed a tampon multiple times through mid-morning. She continues to bleed today, however it has decreased and is now closer to her usual menstrual bleeding.    There are no active problems to display for this patient.   Past Surgical History:  Procedure Laterality Date   WISDOM TOOTH EXTRACTION      Family History  Problem Relation Age of Onset   Alcohol abuse Mother    Cirrhosis Mother    Hypertension Mother    Other Mother 53       cx dysplasia   Heart disease Maternal Grandmother    Hypertension Maternal Grandmother    Lung cancer Maternal Grandfather 60       mets to bone   Breast cancer Other     Social History   Socioeconomic History   Marital status: Married    Spouse name: Not on file   Number of children: 1   Years of education: 16   Highest education level: Not on file  Occupational History   Not on file  Tobacco Use   Smoking status: Never   Smokeless tobacco: Never  Vaping Use   Vaping status: Never Used  Substance  and Sexual Activity   Alcohol use: Yes    Comment: social   Drug use: No   Sexual activity: Yes    Partners: Male  Other Topics Concern   Not on file  Social History Narrative   Not on file   Social Drivers of Health   Financial Resource Strain: Not on file  Food Insecurity: Not on file  Transportation Needs: Not on file  Physical Activity: Inactive (06/14/2017)   Exercise Vital Sign    Days of Exercise per Week: 0 days    Minutes of Exercise per Session: 0 min  Stress: Stress Concern Present (06/14/2017)   [employer] of Occupational Health - Occupational Stress Questionnaire    Feeling of Stress : To some extent  Social Connections: Moderately Integrated (06/14/2017)   Social Connection and Isolation Panel [NHANES]    Frequency of Communication with Friends and Family: More than three times a week    Frequency of Social Gatherings with Friends and Family: Once a week    Attends Religious Services: More than 4 times per year    Active Member of [employer] or Organizations: No    Attends Banker Meetings: Never    Marital Status: Married  Catering manager Violence: Not At Risk (06/14/2017)   Humiliation, Afraid, Rape, and Kick  questionnaire    Fear of Current or Ex-Partner: No    Emotionally Abused: No    Physically Abused: No    Sexually Abused: No    Outpatient Medications Prior to Visit  Medication Sig Dispense Refill   norelgestromin-ethinyl estradiol [employer] [facility]ico) 150-35 MCG/24HR transdermal patch Place 1 patch onto the skin once a week. (Patient not taking: Reported on 10/20/2023) 3 patch 3   No facility-administered medications prior to visit.      ROS:  Review of Systems  Constitutional: Negative.   Respiratory: Negative.    Cardiovascular: Negative.   Genitourinary:  Positive for dyspareunia, menstrual problem, pelvic pain, vaginal bleeding and vaginal pain.     OBJECTIVE:   Vitals:  BP 127/83   Pulse 99   Ht 5\' 2"  (1.575 m)   Wt 137 lb  (62.1 kg)   LMP 10/18/2023   BMI 25.06 kg/m   Physical Exam Constitutional:      Appearance: Normal appearance.  Cardiovascular:     Rate and Rhythm: Normal rate.  Pulmonary:     Effort: Pulmonary effort is normal.  Genitourinary:    General: Normal vulva.     Tanner stage (genital): 5.     Vagina: Tenderness present.     Cervix: Friability present. No cervical motion tenderness.     Uterus: Enlarged and tender.      Adnexa:        Right: No mass or tenderness.         Left: No mass or tenderness.       Comments: Bright red blood noted at cervical os, no clots, without odor. Aptima & pap collected. Uterus enlarge to 6-8w size, tender on bimanual. Skin:    General: Skin is warm and dry.  Neurological:     General: No focal deficit present.     Mental Status: She is alert and oriented to person, place, and time.  Psychiatric:        Mood and Affect: Mood normal.        Behavior: Behavior normal.     Results: No results found for this or any previous visit (from the past 24 hours).   Assessment/Plan: Irregular menses - Plan: CBC, TSH, US PELVIC COMPLETE WITH TRANSVAGINAL  Encounter for screening for human papillomavirus (HPV) - Plan: Cytology - PAP  Cervical cancer screening - Plan: Cervicovaginal ancillary only, Cytology - PAP  Screen for sexually transmitted diseases - Plan: Cervicovaginal ancillary only, Cytology - PAP  Enlarged uterus - Plan: US PELVIC COMPLETE WITH TRANSVAGINAL  Reviewed with [facility] will screen TSH for metabolic cause of irregular menses, ultrasound to assess for fibroids. Pap & STI screen given frequent bleeding after IC. Will follow up via TC with results.  No orders of the defined types were placed in this encounter.    Dominica Severin, CNM 10/20/2023 1:06 PM

## 2023-10-21 LAB — CBC
Hematocrit: 40.1 % (ref 34.0–46.6)
Hemoglobin: 13.4 g/dL (ref 11.1–15.9)
MCH: 30.8 pg (ref 26.6–33.0)
MCHC: 33.4 g/dL (ref 31.5–35.7)
MCV: 92 fL (ref 79–97)
Platelets: 357 10*3/uL (ref 150–450)
RBC: 4.35 x10E6/uL (ref 3.77–5.28)
RDW: 13.3 % (ref 11.7–15.4)
WBC: 6 10*3/uL (ref 3.4–10.8)

## 2023-10-21 LAB — TSH: TSH: 1.71 u[IU]/mL (ref 0.450–4.500)

## 2023-10-22 ENCOUNTER — Encounter: Payer: Self-pay | Admitting: Certified Nurse Midwife

## 2023-10-23 LAB — CERVICOVAGINAL ANCILLARY ONLY
Bacterial Vaginitis (gardnerella): NEGATIVE
Candida Glabrata: NEGATIVE
Candida Vaginitis: NEGATIVE
Chlamydia: NEGATIVE
Comment: NEGATIVE
Comment: NEGATIVE
Comment: NEGATIVE
Comment: NEGATIVE
Comment: NEGATIVE
Comment: NORMAL
Neisseria Gonorrhea: NEGATIVE
Trichomonas: NEGATIVE

## 2023-10-24 LAB — CYTOLOGY - PAP
Adequacy: ABNORMAL
Comment: NEGATIVE

## 2023-10-26 ENCOUNTER — Ambulatory Visit
Admission: RE | Admit: 2023-10-26 | Discharge: 2023-10-26 | Disposition: A | Discharge status: Home / Self Care | Source: Ambulatory Visit | Attending: Certified Nurse Midwife | Admitting: Certified Nurse Midwife

## 2023-10-26 DIAGNOSIS — N926 Irregular menstruation, unspecified: Secondary | ICD-10-CM | POA: Insufficient documentation

## 2023-10-26 DIAGNOSIS — N852 Hypertrophy of uterus: Secondary | ICD-10-CM | POA: Insufficient documentation

## 2023-10-31 ENCOUNTER — Ambulatory Visit: Admitting: Obstetrics and Gynecology

## 2023-11-08 ENCOUNTER — Encounter: Payer: Self-pay | Admitting: Certified Nurse Midwife

## 2023-11-08 NOTE — Progress Notes (Unsigned)
 PCP:  Orson Eva, NP   No chief complaint on file.  Irreg bleeding and pCB 3/1??  HPI:      [name] is a 38 y.o. G1P1001 whose LMP was Patient's last menstrual period was 10/18/2023., presents today for her annual examination.  Her menses are regular every 3-4 wks with xulane, lasting 4 days. Sometimes period starts on 3rd wk of patches and continues to have spotting during placebo wk. Using patches correctly. Dysmenorrhea mild, improved with NSAIDs/heating pad.   Sex activity: single partner, contraception -  Xulane. Had nausea with OCPs, decreased libido with nuvaring, not interested in nexplanon.   Last Pap: 05/29/19  Results were: no abnormalities /neg HPV DNA. No hx of abn paps with bx/tx.  There is a FH of breast cancer in her MGGM, genetic testing not indicated. There is no FH of ovarian cancer (mother had cx dysplasia not ovar ca). The patient does do self-breast exams.  Tobacco use: The patient denies current or previous tobacco use. Alcohol use: social drinker No drug use.  Exercise: moderately active  She does get adequate calcium but not Vitamin D in her diet.   Past Medical History:  Diagnosis Date   Broken clavicle 01/2019   left   Broken foot 01/2019   left   Mitral valve prolapse    Preeclampsia    w/o severe features   Preeclampsia in postpartum period, third trimester 12/01/2015    Past Surgical History:  Procedure Laterality Date   WISDOM TOOTH EXTRACTION      Family History  Problem Relation Age of Onset   Alcohol abuse Mother    Cirrhosis Mother    Hypertension Mother    Other Mother 8       cx dysplasia   Heart disease Maternal Grandmother    Hypertension Maternal Grandmother    Lung cancer Maternal Grandfather 60       mets to bone   Breast cancer Other     Social History   Socioeconomic History   Marital status: Married    Spouse name: Not on file   Number of children: 1   Years of education: 16   Highest  education level: Not on file  Occupational History   Not on file  Tobacco Use   Smoking status: Never   Smokeless tobacco: Never  Vaping Use   Vaping status: Never Used  Substance and Sexual Activity   Alcohol use: Yes    Comment: social   Drug use: No   Sexual activity: Yes    Partners: Male  Other Topics Concern   Not on file  Social History Narrative   Not on file   Social Drivers of Health   Financial Resource Strain: Not on file  Food Insecurity: Not on file  Transportation Needs: Not on file  Physical Activity: Inactive (06/14/2017)   Exercise Vital Sign    Days of Exercise per Week: 0 days    Minutes of Exercise per Session: 0 min  Stress: Stress Concern Present (06/14/2017)   [employer] of Occupational Health - Occupational Stress Questionnaire    Feeling of Stress : To some extent  Social Connections: Moderately Integrated (06/14/2017)   Social Connection and Isolation Panel [NHANES]    Frequency of Communication with Friends and Family: More than three times a week    Frequency of Social Gatherings with Friends and Family: Once a week    Attends Religious Services: More than 4 times per year  Active Member of Clubs or Organizations: No    Attends Banker Meetings: Never    Marital Status: Married  Catering manager Violence: Not At Risk (06/14/2017)   Humiliation, Afraid, Rape, and Kick questionnaire    Fear of Current or Ex-Partner: No    Emotionally Abused: No    Physically Abused: No    Sexually Abused: No    No current outpatient medications on file.     ROS:  Review of Systems  Constitutional:  Negative for fatigue, fever and unexpected weight change.  Respiratory:  Negative for cough, shortness of breath and wheezing.   Cardiovascular:  Negative for chest pain, palpitations and leg swelling.  Gastrointestinal:  Negative for blood in stool, constipation, diarrhea, nausea and vomiting.  Endocrine: Negative for cold intolerance,  heat intolerance and polyuria.  Genitourinary:  Negative for dyspareunia, dysuria, flank pain, frequency, genital sores, hematuria, menstrual problem, pelvic pain, urgency, vaginal bleeding, vaginal discharge and vaginal pain.  Musculoskeletal:  Negative for back pain, joint swelling and myalgias.  Skin:  Negative for rash.  Neurological:  Negative for dizziness, syncope, light-headedness, numbness and headaches.  Hematological:  Negative for adenopathy.  Psychiatric/Behavioral:  Negative for agitation, confusion, sleep disturbance and suicidal ideas. The patient is not nervous/anxious.    BREAST: No symptoms   Objective: LMP 10/18/2023    Physical Exam Constitutional:      Appearance: She is well-developed.  Genitourinary:     Vulva normal.     Right Labia: No rash, tenderness or lesions.    Left Labia: No tenderness, lesions or rash.    No vaginal discharge, erythema or tenderness.      Right Adnexa: not tender and no mass present.    Left Adnexa: not tender and no mass present.    No cervical friability or polyp.     Uterus is not enlarged or tender.  Breasts:    Right: No mass, nipple discharge, skin change or tenderness.     Left: No mass, nipple discharge, skin change or tenderness.  Neck:     Thyroid: No thyromegaly.  Cardiovascular:     Rate and Rhythm: Normal rate and regular rhythm.     Heart sounds: Normal heart sounds. No murmur heard. Pulmonary:     Effort: Pulmonary effort is normal.     Breath sounds: Normal breath sounds.  Abdominal:     Palpations: Abdomen is soft.     Tenderness: There is no abdominal tenderness. There is no guarding or rebound.  Musculoskeletal:        General: Normal range of motion.     Cervical back: Normal range of motion.  Lymphadenopathy:     Cervical: No cervical adenopathy.  Neurological:     General: No focal deficit present.     Mental Status: She is alert and oriented to person, place, and time.     Cranial Nerves: No  cranial nerve deficit.  Skin:    General: Skin is warm and dry.  Psychiatric:        Mood and Affect: Mood normal.        Behavior: Behavior normal.        Thought Content: Thought content normal.        Judgment: Judgment normal.  Vitals reviewed.     Assessment/Plan: Encounter for annual routine gynecological examination  Encounter for surveillance of transdermal patch hormonal contraceptive device - Plan: norelgestromin-ethinyl estradiol Burr [employer]ico) 150-35 MCG/24HR transdermal patch; Rx RF xulane. Has BTB with patch but  declines other BC method.   No orders of the defined types were placed in this encounter.            GYN counsel adequate intake of calcium and vitamin D, diet and exercise     F/U  No follow-ups on file.  Awesome Jared B. Antonyo Hinderer, PA-C 11/08/2023 8:56 PM

## 2023-11-09 ENCOUNTER — Encounter: Payer: Self-pay | Admitting: Obstetrics and Gynecology

## 2023-11-09 ENCOUNTER — Other Ambulatory Visit (HOSPITAL_COMMUNITY)
Admission: RE | Admit: 2023-11-09 | Discharge: 2023-11-09 | Disposition: A | Discharge status: Home / Self Care | Source: Ambulatory Visit | Attending: Obstetrics and Gynecology | Admitting: Obstetrics and Gynecology

## 2023-11-09 ENCOUNTER — Ambulatory Visit: Payer: Self-pay | Admitting: Obstetrics and Gynecology

## 2023-11-09 VITALS — BP 114/60 | Ht 62.0 in | Wt 133.0 lb

## 2023-11-09 DIAGNOSIS — Z01419 Encounter for gynecological examination (general) (routine) without abnormal findings: Secondary | ICD-10-CM | POA: Diagnosis not present

## 2023-11-09 DIAGNOSIS — Z1151 Encounter for screening for human papillomavirus (HPV): Secondary | ICD-10-CM | POA: Insufficient documentation

## 2023-11-09 DIAGNOSIS — Z124 Encounter for screening for malignant neoplasm of cervix: Secondary | ICD-10-CM | POA: Insufficient documentation

## 2023-11-09 DIAGNOSIS — N930 Postcoital and contact bleeding: Secondary | ICD-10-CM

## 2023-11-09 DIAGNOSIS — Z30011 Encounter for initial prescription of contraceptive pills: Secondary | ICD-10-CM

## 2023-11-09 MED ORDER — DOXYCYCLINE HYCLATE 100 MG PO CAPS: 100.0000 mg | ORAL_CAPSULE | Freq: Two times a day (BID) | ORAL | 0 refills | Status: AC

## 2023-11-09 MED ORDER — JUNEL FE 1/20 1-20 MG-MCG PO TABS: 1.0000 | ORAL_TABLET | Freq: Every day | ORAL | 3 refills | Status: AC

## 2023-11-09 NOTE — Patient Instructions (Signed)
 I value your feedback and you entrusting [place] with your care. If you get a [facility] patient survey, I would appreciate you taking the time to let us know about your experience today. Thank you! ? ? ?

## 2023-11-14 LAB — CYTOLOGY - PAP
Comment: NEGATIVE
Diagnosis: NEGATIVE
High risk HPV: NEGATIVE

## 2024-02-15 ENCOUNTER — Emergency Department: Admission: EM | Admit: 2024-02-15 | Discharge: 2024-02-15 | Disposition: A | Discharge status: Home / Self Care

## 2024-02-15 ENCOUNTER — Other Ambulatory Visit: Payer: Self-pay

## 2024-02-15 DIAGNOSIS — S199XXA Unspecified injury of neck, initial encounter: Secondary | ICD-10-CM | POA: Diagnosis present

## 2024-02-15 DIAGNOSIS — R519 Headache, unspecified: Secondary | ICD-10-CM | POA: Diagnosis not present

## 2024-02-15 DIAGNOSIS — S161XXA Strain of muscle, fascia and tendon at neck level, initial encounter: Secondary | ICD-10-CM | POA: Diagnosis not present

## 2024-02-15 DIAGNOSIS — X58XXXA Exposure to other specified factors, initial encounter: Secondary | ICD-10-CM | POA: Diagnosis not present

## 2024-02-15 LAB — POC URINE PREG, ED: Preg Test, Ur: NEGATIVE

## 2024-02-15 MED ORDER — IBUPROFEN 200 MG PO TABS: 600.0000 mg | ORAL_TABLET | Freq: Three times a day (TID) | ORAL | 2 refills | Status: AC | PRN

## 2024-02-15 MED ORDER — LIDOCAINE 5 % EX PTCH: 1.0000 | MEDICATED_PATCH | CUTANEOUS | 0 refills | Status: AC

## 2024-02-15 MED ORDER — ACETAMINOPHEN 500 MG PO TABS: 1000.0000 mg | ORAL_TABLET | Freq: Four times a day (QID) | ORAL | 2 refills | Status: AC | PRN

## 2024-02-15 MED ORDER — ACETAMINOPHEN 500 MG PO TABS
1000.0000 mg | ORAL_TABLET | Freq: Once | ORAL | Status: AC
  Administered 2024-02-15: 1000 mg via ORAL
  Filled 2024-02-15: qty 2

## 2024-02-15 MED ORDER — DIPHENHYDRAMINE HCL 25 MG PO CAPS
25.0000 mg | ORAL_CAPSULE | Freq: Once | ORAL | Status: AC
  Administered 2024-02-15: 25 mg via ORAL
  Filled 2024-02-15: qty 1

## 2024-02-15 MED ORDER — CYCLOBENZAPRINE HCL 5 MG PO TABS: 5.0000 mg | ORAL_TABLET | Freq: Three times a day (TID) | ORAL | 0 refills | Status: AC | PRN

## 2024-02-15 MED ORDER — METOCLOPRAMIDE HCL 10 MG PO TABS
10.0000 mg | ORAL_TABLET | Freq: Once | ORAL | Status: AC
  Administered 2024-02-15: 10 mg via ORAL
  Filled 2024-02-15: qty 1

## 2024-02-15 MED ORDER — LIDOCAINE-EPINEPHRINE (PF) 2 %-1:200000 IJ SOLN
10.0000 mL | Freq: Once | INTRAMUSCULAR | Status: AC
  Administered 2024-02-15: 10 mL via INTRADERMAL
  Filled 2024-02-15: qty 20

## 2024-02-15 NOTE — Discharge Instructions (Addendum)
 Your evaluation in the emergency department was reassuring, and I suspect you have a muscle strain of your neck.  Your symptoms improved with treatment, and I prescribed you several medications to use as needed for any ongoing discomfort.  Please do follow-up with your primary care provider for reevaluation, and return to the emergency department with any new or worsening symptoms.

## 2024-02-15 NOTE — ED Provider Notes (Signed)
 [facility] Provider Note    Event Date/Time   First MD Initiated Contact with Patient 02/15/24 1650     (approximate)   History   Headache  Patient states headache for one week; now having pain in neck. No relief with Tylenol .    HPI Rachael Morris is a 38 y.o. female no related past medical history presents for evaluation of neck pain -Localizes to the left posterior neck.  No preceding trauma.  No focal weakness or paresthesias.  Did note that she had headache for several days last week though has been intermittent this week.  Currently mild, 3/10, responds well to Motrin .  No vision change.  Gradual onset.  Not worst headache of life. -No fever -Denies rashes anywhere on her body -Otherwise has been in her usual state of health -Took Motrin  earlier today     Physical Exam   Triage Vital Signs: ED Triage Vitals  Encounter Vitals Group     BP 02/15/24 1543 (!) 140/96     Girls Systolic BP Percentile --      Girls Diastolic BP Percentile --      Boys Systolic BP Percentile --      Boys Diastolic BP Percentile --      Pulse Rate 02/15/24 1543 99     Resp 02/15/24 1543 20     Temp 02/15/24 1543 98.3 F (36.8 C)     Temp Source 02/15/24 1543 Oral     SpO2 02/15/24 1543 100 %     Weight 02/15/24 1544 136 lb (61.7 kg)     Height 02/15/24 1544 5' 2 (1.575 m)     Head Circumference --      Peak Flow --      Pain Score 02/15/24 1543 8     Pain Loc --      Pain Education --      Exclude from Growth Chart --     Most recent vital signs: Vitals:   02/15/24 1543  BP: (!) 140/96  Pulse: 99  Resp: 20  Temp: 98.3 F (36.8 C)  SpO2: 100%     General: Awake, no distress.  Neck:  Range of motion intact though experiences pain in the left paracervical muscles with ranging.  No meningismus. CV:  Good peripheral perfusion.  Resp:  Normal effort.  Neuro:  Aox4, CN II-XII intact, FNF wnl, finger taps fast b/l, 5/5 strength in bilateral finger  extension/grip, arm flexion/extension, EHL/FHL. BUE AG 10+ sec no drift, BLE AG 5+ sec no drift. Ambulates with steady gait. SILT. Negative Rhomberg. Other:  No rashes on visible skin   ED Results / Procedures / Treatments   Labs (all labs ordered are listed, but only abnormal results are displayed) Labs Reviewed  POC URINE PREG, ED     EKG  N/a   RADIOLOGY N/a    PROCEDURES:  Critical Care performed: No  .Nerve Block  Date/Time: 02/15/2024 6:45 PM  Performed by: Clarine Ozell LABOR, MD Authorized by: Clarine Ozell LABOR, MD   Consent:    Consent obtained:  Verbal   Consent given by:  Patient   Risks, benefits, and alternatives were discussed: yes     Risks discussed:  Allergic reaction, bleeding, unsuccessful block, pain, infection and intravenous injection   Alternatives discussed:  No treatment and delayed treatment Universal protocol:    Patient identity confirmed:  Verbally with patient and arm band Indications:    Indications:  Pain relief Location:  Nerve block body site: L posterior neck.   Laterality:  Left Pre-procedure details:    Skin preparation:  Alcohol   Preparation: Patient was prepped and draped in usual sterile fashion   Skin anesthesia:    Skin anesthesia method:  Local infiltration   Local anesthetic:  Lidocaine  2% WITH epi Procedure details:    Block needle gauge:  21 G   Anesthetic injected:  Lidocaine  2% WITH epi   Steroid injected:  None   Additive injected:  None   Injection procedure:  Anatomic landmarks identified, incremental injection, negative aspiration for blood, introduced needle and anatomic landmarks palpated   Paresthesia:  None Post-procedure details:    Dressing: bandaid.   Outcome:  Pain relieved   Procedure completion:  Tolerated    MEDICATIONS ORDERED IN ED: Medications  acetaminophen  (TYLENOL ) tablet 1,000 mg (1,000 mg Oral Given 02/15/24 1740)  metoCLOPramide  (REGLAN ) tablet 10 mg (10 mg Oral Given 02/15/24 1741)   diphenhydrAMINE  (BENADRYL ) capsule 25 mg (25 mg Oral Given 02/15/24 1741)  lidocaine -EPINEPHrine  (XYLOCAINE  W/EPI) 2 %-1:200000 (PF) injection 10 mL (10 mLs Intradermal Given 02/15/24 1740)     IMPRESSION / MDM / ASSESSMENT AND PLAN / ED COURSE  I reviewed the triage vital signs and the nursing notes.                              DDX/MDM/AP: Differential diagnosis includes, but is not limited to, apparent muscle strain/spasm, do not clinically suspect meningitis in this very well-appearing patient with focal left paracervical discomfort.  Highly doubt intracranial mass or hemorrhage--offered screening brain imaging though in shared decision making patient prefers to defer at this time, notes she primarily has neck pain at this time and her headache is much better than it was last week.  Reassuring exam here with point tenderness left paracervical muscles and nonfocal neurologic exam.  Plan: - Tylenol , Reglan , Benadryl  - Offered screening labs and IV placement, patient declines I believe is very reasonable - Trigger point injection  Patient's presentation is most consistent with acute, uncomplicated illness.  ED course below.  Patient with significant relief within a few minutes of trigger point injection, able to range neck without difficulty.  Presentation overall consistent with cervical strain.  Rx Tylenol , Motrin , Flexeril , Lidoderm  patches.  Plan for PMD follow-up.  ED return precautions in place.  No concern for acute pathology at this time.  Patient agrees with plan.  Clinical Course as of 02/15/24 1847  Thu Feb 15, 2024  1733 Preg Test, Ur: Negative [MM]    Clinical Course User Index [MM] Clarine Ozell LABOR, MD     FINAL CLINICAL IMPRESSION(S) / ED DIAGNOSES   Final diagnoses:  Strain of neck muscle, initial encounter  Nonintractable headache, unspecified chronicity pattern, unspecified headache type     Rx / DC Orders   ED Discharge Orders          Ordered     acetaminophen  (TYLENOL ) 500 MG tablet  Every 6 hours PRN        02/15/24 1844    ibuprofen  (MOTRIN  IB) 200 MG tablet  Every 8 hours PRN        02/15/24 1844    cyclobenzaprine  (FLEXERIL ) 5 MG tablet  3 times daily PRN        02/15/24 1844    lidocaine  (LIDODERM ) 5 %  Every 24 hours        02/15/24 1844  Note:  This document was prepared using Dragon voice recognition software and may include unintentional dictation errors.   Clarine Ozell LABOR, MD 02/15/24 [phone]

## 2024-02-15 NOTE — ED Triage Notes (Signed)
 Patient states headache for one week; now having pain in neck. No relief with Tylenol .

## 2024-02-15 NOTE — ED Notes (Signed)
 ED Provider at bedside.
